# Patient Record
Sex: Female | Born: 2000
Health system: Southern US, Community
[De-identification: ages and names within clinical notes are randomized; demographics above are authoritative.]

## PROBLEM LIST (undated history)

## (undated) DIAGNOSIS — R569 Unspecified convulsions: Secondary | ICD-10-CM

## (undated) DIAGNOSIS — R51 Headache: Secondary | ICD-10-CM

## (undated) DIAGNOSIS — G8194 Hemiplegia, unspecified affecting left nondominant side: Secondary | ICD-10-CM

## (undated) DIAGNOSIS — I63541 Cerebral infarction due to unspecified occlusion or stenosis of right cerebellar artery: Secondary | ICD-10-CM

## (undated) DIAGNOSIS — F419 Anxiety disorder, unspecified: Secondary | ICD-10-CM

## (undated) DIAGNOSIS — G809 Cerebral palsy, unspecified: Secondary | ICD-10-CM

## (undated) DIAGNOSIS — G40909 Epilepsy, unspecified, not intractable, without status epilepticus: Secondary | ICD-10-CM

## (undated) HISTORY — DX: Hemiplegia, unspecified affecting left nondominant side: G81.94

## (undated) HISTORY — DX: Cerebral infarction due to unspecified occlusion or stenosis of right cerebellar artery: I63.541

## (undated) HISTORY — DX: Cerebral palsy, unspecified: G80.9

## (undated) HISTORY — DX: Anxiety disorder, unspecified: F41.9

## (undated) HISTORY — DX: Headache: R51

## (undated) HISTORY — PX: NO PAST SURGERIES: SHX2092

## (undated) HISTORY — DX: Epilepsy, unspecified, not intractable, without status epilepticus: G40.909

## (undated) HISTORY — DX: Unspecified convulsions: R56.9

---
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
## 2019-01-13 ENCOUNTER — Ambulatory Visit: Payer: 59 | Admitting: Child and Adolescent Psychiatry

## 2019-02-23 DIAGNOSIS — H52223 Regular astigmatism, bilateral: Secondary | ICD-10-CM | POA: Diagnosis not present

## 2019-02-23 DIAGNOSIS — H5213 Myopia, bilateral: Secondary | ICD-10-CM | POA: Diagnosis not present

## 2019-04-12 ENCOUNTER — Encounter: Payer: Self-pay | Admitting: Family Medicine

## 2019-04-12 ENCOUNTER — Ambulatory Visit (INDEPENDENT_AMBULATORY_CARE_PROVIDER_SITE_OTHER): Payer: BC Managed Care – PPO | Admitting: Family Medicine

## 2019-04-12 ENCOUNTER — Other Ambulatory Visit: Payer: Self-pay

## 2019-04-12 VITALS — BP 106/60 | HR 74

## 2019-04-12 DIAGNOSIS — Z309 Encounter for contraceptive management, unspecified: Secondary | ICD-10-CM

## 2019-04-12 LAB — POCT URINE PREGNANCY: Preg Test, Ur: NEGATIVE

## 2019-04-12 MED ORDER — DROSPIRENONE-ETHINYL ESTRADIOL 3-0.02 MG PO TABS: 1.0000 | ORAL_TABLET | Freq: Every day | ORAL | 11 refills | Status: DC

## 2019-04-12 NOTE — Patient Instructions (Signed)
Thank you for coming in to see [place] today! Please see below to review our plan for today's visit:  1. You are being prescribed generic Yaz combination birth control to help with heavy periods and menstrual cramps. Do not smoke while taking this medicine as this can cause blood clots. Know that alcohol can reduce the effect of this medication.   Please call the clinic at [phone] if your symptoms worsen or you have any concerns. It was our pleasure to serve you!   Dr. Milus Banister [facility]

## 2019-04-12 NOTE — Progress Notes (Signed)
   Subjective:    Patient ID: Christina Mccall, female    DOB: 15-Jan-2001, 18 y.o.   MRN: [id number]   CC: Contraceptive planning  HPI: Patient is a 18 year old with history of abnormal uterine bleeding and painful periods is coming in for contraceptive planning.  She was recently on Depo but said this made her gain a lot of weight.  The patient says she is interested in taking a birth control pill for contraception.  The patient was explained that she should plan to take the pill every day at the same time and that it is not 100% effective at preventing pregnancy.  She was also warned that birth control does not prevent sexually transmitted infection and if she plans to have sex she should wear a form of barrier protection, such as a condom.  The patient was counseled to not consume alcohol as this will decrease the effectiveness of her birth control and to avoid smoking as this can cause increased chances of clots.  Otherwise, she denies fevers, headaches, vision changes, chest pain, shortness of breath, nausea, vomiting, constipation, diarrhea, and new/changed focal weaknesses.  The patient does have a history of migraine but this is without aura, and therefore can have combination birth control.  Smoking status reviewed: Never smoker  Review of Systems: See HPI   Objective:  BP 106/60   Pulse 74  Vitals and nursing note reviewed  General: well nourished, in no acute distress Cardiac: RRR, clear S1 and S2, no murmurs, rubs, or gallops Respiratory: clear to auscultation bilaterally, no increased work of breathing Abdomen: soft, nontender, nondistended, no masses or organomegaly. Bowel sounds present Extremities: no edema or cyanosis. Warm, well perfused. 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   Dr. Milus Banister [facility], PGY-2

## 2019-04-12 NOTE — Assessment & Plan Note (Signed)
-  Start generic Yaz to be taken daily at the same time

## 2019-04-22 ENCOUNTER — Other Ambulatory Visit: Payer: Self-pay

## 2019-04-22 ENCOUNTER — Ambulatory Visit (HOSPITAL_COMMUNITY)
Admission: EM | Admit: 2019-04-22 | Discharge: 2019-04-22 | Disposition: A | Discharge status: Home / Self Care | Payer: No Typology Code available for payment source | Source: Ambulatory Visit | Attending: Emergency Medicine | Admitting: Emergency Medicine

## 2019-04-22 ENCOUNTER — Encounter (HOSPITAL_COMMUNITY): Payer: Self-pay | Admitting: Emergency Medicine

## 2019-04-22 ENCOUNTER — Telehealth (HOSPITAL_COMMUNITY): Payer: Self-pay | Admitting: Student

## 2019-04-22 ENCOUNTER — Emergency Department (HOSPITAL_COMMUNITY)
Admission: EM | Admit: 2019-04-22 | Discharge: 2019-04-22 | Disposition: A | Discharge status: Home / Self Care | Payer: BC Managed Care – PPO | Attending: Emergency Medicine | Admitting: Emergency Medicine

## 2019-04-22 DIAGNOSIS — T7421XA Adult sexual abuse, confirmed, initial encounter: Secondary | ICD-10-CM | POA: Diagnosis not present

## 2019-04-22 DIAGNOSIS — Z0441 Encounter for examination and observation following alleged adult rape: Secondary | ICD-10-CM | POA: Insufficient documentation

## 2019-04-22 DIAGNOSIS — N3000 Acute cystitis without hematuria: Secondary | ICD-10-CM | POA: Diagnosis not present

## 2019-04-22 DIAGNOSIS — G809 Cerebral palsy, unspecified: Secondary | ICD-10-CM | POA: Insufficient documentation

## 2019-04-22 DIAGNOSIS — R102 Pelvic and perineal pain: Secondary | ICD-10-CM | POA: Diagnosis not present

## 2019-04-22 DIAGNOSIS — Z79899 Other long term (current) drug therapy: Secondary | ICD-10-CM | POA: Diagnosis not present

## 2019-04-22 LAB — URINALYSIS, ROUTINE W REFLEX MICROSCOPIC
Bilirubin Urine: NEGATIVE
Glucose, UA: NEGATIVE mg/dL
Hgb urine dipstick: NEGATIVE
Ketones, ur: NEGATIVE mg/dL
Nitrite: NEGATIVE
Protein, ur: NEGATIVE mg/dL
Specific Gravity, Urine: 1.024 (ref 1.005–1.030)
Trans Epithel, UA: 1
pH: 5 (ref 5.0–8.0)

## 2019-04-22 MED ORDER — CEPHALEXIN 500 MG PO CAPS: 500.0000 mg | ORAL_CAPSULE | Freq: Three times a day (TID) | ORAL | 0 refills | Status: AC
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
## 2019-04-22 NOTE — Telephone Encounter (Signed)
Patient called and notified about urinalysis results, Keflex called into pharmacy, pharmacy verified with patient.  She expresses understanding and agreement with this plan.

## 2019-04-22 NOTE — ED Notes (Signed)
Sane nurse at bedside 

## 2019-04-22 NOTE — SANE Note (Signed)
The SANE/FNE Naval architect) consult has been completed. The primaryRN and physician/PA have been notified. Please contact the SANE/FNE nurse on call (listed in [place]) with any further concerns.
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
## 2019-04-22 NOTE — ED Notes (Signed)
Ivin Booty moms name she would like to be updated at [phone]
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
## 2019-04-25 LAB — POC URINE PREG, ED: Preg Test, Ur: NEGATIVE

## 2019-05-17 ENCOUNTER — Other Ambulatory Visit: Payer: Self-pay

## 2019-05-17 ENCOUNTER — Encounter: Payer: Self-pay | Admitting: Child and Adolescent Psychiatry

## 2019-05-17 ENCOUNTER — Ambulatory Visit (INDEPENDENT_AMBULATORY_CARE_PROVIDER_SITE_OTHER): Payer: 59 | Admitting: Child and Adolescent Psychiatry

## 2019-05-17 DIAGNOSIS — F411 Generalized anxiety disorder: Secondary | ICD-10-CM

## 2019-05-17 DIAGNOSIS — F39 Unspecified mood [affective] disorder: Secondary | ICD-10-CM | POA: Diagnosis not present

## 2019-05-17 MED ORDER — SERTRALINE HCL 50 MG PO TABS: 75.0000 mg | ORAL_TABLET | Freq: Every day | ORAL | 0 refills | Status: DC

## 2019-05-17 MED ORDER — TRAZODONE HCL 50 MG PO TABS: 50.0000 mg | ORAL_TABLET | Freq: Every day | ORAL | 2 refills | Status: DC
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
## 2019-07-10 ENCOUNTER — Ambulatory Visit (INDEPENDENT_AMBULATORY_CARE_PROVIDER_SITE_OTHER): Payer: 59 | Admitting: Child and Adolescent Psychiatry

## 2019-07-10 ENCOUNTER — Other Ambulatory Visit: Payer: Self-pay

## 2019-07-10 ENCOUNTER — Encounter: Payer: Self-pay | Admitting: Child and Adolescent Psychiatry

## 2019-07-10 DIAGNOSIS — F411 Generalized anxiety disorder: Secondary | ICD-10-CM | POA: Diagnosis not present

## 2019-07-10 DIAGNOSIS — F901 Attention-deficit hyperactivity disorder, predominantly hyperactive type: Secondary | ICD-10-CM

## 2019-07-10 DIAGNOSIS — F39 Unspecified mood [affective] disorder: Secondary | ICD-10-CM

## 2019-07-10 MED ORDER — TRAZODONE HCL 50 MG PO TABS: 75.0000 mg | ORAL_TABLET | Freq: Every day | ORAL | 2 refills | Status: DC

## 2019-07-10 MED ORDER — HYDROXYZINE HCL 10 MG PO TABS: 10.0000 mg | ORAL_TABLET | Freq: Every evening | ORAL | 0 refills | Status: DC | PRN

## 2019-07-10 MED ORDER — SERTRALINE HCL 50 MG PO TABS: 75.0000 mg | ORAL_TABLET | Freq: Every day | ORAL | 2 refills | Status: DC
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
## 2019-07-13 ENCOUNTER — Telehealth: Payer: Self-pay

## 2019-07-13 NOTE — Telephone Encounter (Signed)
pt mother called wanted to know if the hydroxyzine had the same make up as benadryl states her daughter has seizure on that medication and she doesnt want to take any changes with the hydroxyzine

## 2019-07-13 NOTE — Telephone Encounter (Signed)
Yes hydroxyzine has a same make up as benadryl, they can choose to not take it. I had increased her trazodone so that should help with the sleep, and recommended to take hydroxyzine only if increased dose of trazodone does not work for her. Please call her and let her know. Thanks

## 2019-08-10 ENCOUNTER — Other Ambulatory Visit: Payer: Self-pay | Admitting: Child and Adolescent Psychiatry

## 2019-08-10 DIAGNOSIS — F411 Generalized anxiety disorder: Secondary | ICD-10-CM

## 2019-08-17 ENCOUNTER — Ambulatory Visit (INDEPENDENT_AMBULATORY_CARE_PROVIDER_SITE_OTHER): Payer: 59 | Admitting: Child and Adolescent Psychiatry

## 2019-08-17 ENCOUNTER — Encounter: Payer: Self-pay | Admitting: Child and Adolescent Psychiatry

## 2019-08-17 ENCOUNTER — Other Ambulatory Visit: Payer: Self-pay

## 2019-08-17 DIAGNOSIS — F411 Generalized anxiety disorder: Secondary | ICD-10-CM | POA: Diagnosis not present

## 2019-08-17 DIAGNOSIS — F902 Attention-deficit hyperactivity disorder, combined type: Secondary | ICD-10-CM

## 2019-08-17 DIAGNOSIS — F39 Unspecified mood [affective] disorder: Secondary | ICD-10-CM | POA: Diagnosis not present

## 2019-08-17 MED ORDER — SERTRALINE HCL 50 MG PO TABS: 75.0000 mg | ORAL_TABLET | Freq: Every day | ORAL | 2 refills | Status: DC

## 2019-08-17 MED ORDER — TRAZODONE HCL 50 MG PO TABS: 75.0000 mg | ORAL_TABLET | Freq: Every day | ORAL | 2 refills | Status: DC
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
## 2019-09-13 DIAGNOSIS — R519 Headache, unspecified: Secondary | ICD-10-CM | POA: Diagnosis not present

## 2019-09-13 DIAGNOSIS — R6883 Chills (without fever): Secondary | ICD-10-CM | POA: Diagnosis not present

## 2019-09-13 DIAGNOSIS — Z20828 Contact with and (suspected) exposure to other viral communicable diseases: Secondary | ICD-10-CM | POA: Diagnosis not present

## 2019-09-13 DIAGNOSIS — R05 Cough: Secondary | ICD-10-CM | POA: Diagnosis not present

## 2019-10-18 ENCOUNTER — Other Ambulatory Visit: Payer: Self-pay

## 2019-10-18 ENCOUNTER — Ambulatory Visit (INDEPENDENT_AMBULATORY_CARE_PROVIDER_SITE_OTHER): Payer: BC Managed Care – PPO | Admitting: Family Medicine

## 2019-10-18 ENCOUNTER — Encounter: Payer: Self-pay | Admitting: Family Medicine

## 2019-10-18 VITALS — BP 110/62 | HR 76 | Wt 136.0 lb

## 2019-10-18 DIAGNOSIS — K5909 Other constipation: Secondary | ICD-10-CM

## 2019-10-18 NOTE — Patient Instructions (Signed)
Please drink at least 64-72 oz of water a day, every day. Please ensure you are getting adequate fiber in yoru diet Take Miralax: 2 cap fulls a day, everyday, until you have a soft bowel movement. Then continue once a day until you continue to have regular soft bowel movements. Then you can taper down to every other day and then as needed.  Follow up in 2-3 weeks if no improvement with the above regimen.   Take care, Dr. Mauri Reading

## 2019-10-18 NOTE — Progress Notes (Signed)
   Subjective:   Patient ID: Christina Mccall    DOB: 2001-07-03, 19 y.o. female   MRN: [id number]  Christina Mccall is a 19 y.o. female here for Constipation  Constipation: Patient notes she has chronic constipation x years. Patient notes she hasn't had a bowel movement in 7 days. She notes on average she goes every four days. Notes her stools are brown in color and range from Type 1 or Type 2 on the [facility] stool chart. Notes she has abdominal pain, and has recently developed nausea. Drinks about 48 oz of water or less a day. Has tried OTC Miralax, metamucil, and laxatives. She used these more on an as needed bases. She notes they did not help.   Review of Systems:  Per HPI.   PMFSH, medications and smoking status reviewed.  Objective:   BP 110/62   Pulse 76   Wt 136 lb (61.7 kg)   SpO2 98%   BMI 22.63 kg/m  Vitals and nursing note reviewed.  General: very thing young female, sitting comfortably in exam chair, in no acute distress with non-toxic appearance Resp: Breathing comfortably on room air, speaking in full sentences Abdomen: soft, tender to deep palpation throughout, non-distended but stool firm stool palpable throughout, no masses or organomegaly palpable, normoactive bowel sounds appreciated Skin: warm, dry  MSK: antalgic gait  Assessment & Plan:   Other constipation Patient presenting with chronic constipation of Type 1 or 2 on bristol scale. No melena or BRBPR. She has had minimal relief with PRN OTC medicines. She only drinks at most 48 oz of water a day. On exam, firm stool noted throughout colon however normoactive bowel sounds appreciated. No concern for outlet obstruction at this time. However, if persistent despite treatment below then will need further imaging vs rectal exam to further evaluate. At this time, recommend increasing water consumption to at least 64-72oz per day, diet filled with adequate fiber, and Miralax 2 cap fulls BID until soft stool then QD until she  has regular soft stools, then PRN. Recommend she avoid laxatives as this can cause rebound constipation. Follow up if no improvement. Can also consider Amitiza given the chronicity of problem for patient.   Orpah Cobb, DO PGY-2, [facility] 10/19/2019 1:39 PM

## 2019-10-19 DIAGNOSIS — K5909 Other constipation: Secondary | ICD-10-CM | POA: Insufficient documentation

## 2019-10-19 NOTE — Assessment & Plan Note (Addendum)
Patient presenting with chronic constipation of Type 1 or 2 on bristol scale. No melena or BRBPR. She has had minimal relief with PRN OTC medicines. She only drinks at most 48 oz of water a day. On exam, firm stool noted throughout colon however normoactive bowel sounds appreciated. No concern for outlet obstruction at this time. However, if persistent despite treatment below then will need further imaging vs rectal exam to further evaluate. At this time, recommend increasing water consumption to at least 64-72oz per day, diet filled with adequate fiber, and Miralax 2 cap fulls BID until soft stool then QD until she has regular soft stools, then PRN. Recommend she avoid laxatives as this can cause rebound constipation. Follow up if no improvement. Can also consider Amitiza given the chronicity of problem for patient.

## 2019-11-16 ENCOUNTER — Other Ambulatory Visit: Payer: Self-pay

## 2019-11-16 ENCOUNTER — Ambulatory Visit (INDEPENDENT_AMBULATORY_CARE_PROVIDER_SITE_OTHER): Payer: 59 | Admitting: Child and Adolescent Psychiatry

## 2019-11-16 ENCOUNTER — Encounter: Payer: Self-pay | Admitting: Child and Adolescent Psychiatry

## 2019-11-16 DIAGNOSIS — F909 Attention-deficit hyperactivity disorder, unspecified type: Secondary | ICD-10-CM

## 2019-11-16 DIAGNOSIS — F411 Generalized anxiety disorder: Secondary | ICD-10-CM | POA: Diagnosis not present

## 2019-11-16 DIAGNOSIS — G4700 Insomnia, unspecified: Secondary | ICD-10-CM | POA: Diagnosis not present

## 2019-11-16 DIAGNOSIS — F39 Unspecified mood [affective] disorder: Secondary | ICD-10-CM | POA: Diagnosis not present

## 2019-11-16 MED ORDER — SERTRALINE HCL 50 MG PO TABS: 75.0000 mg | ORAL_TABLET | Freq: Every day | ORAL | 2 refills | Status: DC

## 2019-11-16 MED ORDER — TRAZODONE HCL 50 MG PO TABS: 100.0000 mg | ORAL_TABLET | Freq: Every day | ORAL | 2 refills | Status: DC
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
## 2020-02-02 ENCOUNTER — Other Ambulatory Visit: Payer: Self-pay

## 2020-02-02 ENCOUNTER — Ambulatory Visit (INDEPENDENT_AMBULATORY_CARE_PROVIDER_SITE_OTHER): Payer: BC Managed Care – PPO | Admitting: Family Medicine

## 2020-02-02 ENCOUNTER — Encounter: Payer: Self-pay | Admitting: Family Medicine

## 2020-02-02 VITALS — BP 96/62 | HR 74 | Ht 65.0 in | Wt 146.0 lb

## 2020-02-02 DIAGNOSIS — R350 Frequency of micturition: Secondary | ICD-10-CM

## 2020-02-02 LAB — POCT URINALYSIS DIP (MANUAL ENTRY)
Bilirubin, UA: NEGATIVE
Blood, UA: NEGATIVE
Glucose, UA: NEGATIVE mg/dL
Ketones, POC UA: NEGATIVE mg/dL
Nitrite, UA: NEGATIVE
Spec Grav, UA: >=1.03 — AB (ref 1.010–1.025)
Urobilinogen, UA: 0.2 E.U./dL
pH, UA: 6 (ref 5.0–8.0)

## 2020-02-02 NOTE — Progress Notes (Signed)
    SUBJECTIVE:   CHIEF COMPLAINT / HPI:   Frequent urination: States she urinates 40+ times per day, every day for years. States she has to get up 5-6 times or more a night to urinate. Some dysuria. No dizziness/lightheadedness. No blood in urine. Has had this happen before but it resolved on its own after a year. Currently this has been going on for 2 years or more. Drinks ~1 bottle of water per day and 1 can of soda every day or two.   PERTINENT  PMH / PSH: None relevant  OBJECTIVE:   BP 96/62   Pulse 74   Ht 5\' 5"  (1.651 m)   Wt 146 lb (66.2 kg)   SpO2 97%   BMI 24.30 kg/m   General: Alert and oriented in no apparent distress Heart: Regular rate and rhythm with no murmurs appreciated Lungs: CTA bilaterally, no wheezing  Abdomen: Bowel sounds present, mild abdominal discomfort to palpation in central abdomen without peritoneal findings. Skin: Warm and dry   ASSESSMENT/PLAN:   Urinary frequency Assessment: Urinary frequency with patient claiming to urinate 40+ times per day including waking up multiple times at night to urinate.  Patient denies large fluid intake.  Differential can include diabetes, other electrolyte disturbances, renal insufficiency or other renal issue, urinary tract infection, medication induced, psychogenic.  Urinalysis shows no signs of infection or glucose in the urine.  There is also the possibility that urinary frequency is not as dramatic as patient believes. Plan: -We will check BMP to look for creatinine as well as electrolyte disturbances -We will record 24-hour creatinine clearance via urine collection to determine both amount of urine and creatinine clearance -Recommend patient keep a diary of amount of time she is consumes any fluid as well as number of times that she voids per day -Follow-up in 2 months or sooner if symptoms worsen.     , DO [facility]

## 2020-02-02 NOTE — Patient Instructions (Addendum)
It was great to see you!  Our plans for today:  -We are checking some blood work to see how your kidney function electrolytes appear. -We are also going to do a 24-hour urine collection to determine the amount of urine you are losing each day. We will contact you once the labs result -I would like for you make a follow-up appointment if you have worsening symptoms or any change in symptoms.    -I would also like for you to keep a diary of number of times you are urinating and amounts of intake you are bringing in.  We are checking some labs today, I will call you if they are abnormal will send you a MyChart message or a letter if they are normal.  If you do not hear about your labs in the next 2 weeks please let us know.  Take care and seek immediate care sooner if you develop any concerns.   Dr. [facility]

## 2020-02-02 NOTE — Assessment & Plan Note (Signed)
Assessment: Urinary frequency with patient claiming to urinate 40+ times per day including waking up multiple times at night to urinate.  Patient denies large fluid intake.  Differential can include diabetes, other electrolyte disturbances, renal insufficiency or other renal issue, urinary tract infection, medication induced, psychogenic.  Urinalysis shows no signs of infection or glucose in the urine.  There is also the possibility that urinary frequency is not as dramatic as patient believes. Plan: -We will check BMP to look for creatinine as well as electrolyte disturbances -We will record 24-hour creatinine clearance via urine collection to determine both amount of urine and creatinine clearance -Recommend patient keep a diary of amount of time she is consumes any fluid as well as number of times that she voids per day -Follow-up in 2 months or sooner if symptoms worsen.

## 2020-02-03 LAB — BASIC METABOLIC PANEL
BUN/Creatinine Ratio: 14 (ref 9–23)
BUN: 11 mg/dL (ref 6–20)
CO2: 20 mmol/L (ref 20–29)
Calcium: 9.1 mg/dL (ref 8.7–10.2)
Chloride: 109 mmol/L — ABNORMAL HIGH (ref 96–106)
Creatinine, Ser: 0.79 mg/dL (ref 0.57–1.00)
GFR calc Af Amer: 125 mL/min/{1.73_m2} (ref >59)
GFR calc non Af Amer: 108 mL/min/{1.73_m2} (ref >59)
Glucose: 84 mg/dL (ref 65–99)
Potassium: 4.5 mmol/L (ref 3.5–5.2)
Sodium: 143 mmol/L (ref 134–144)

## 2020-02-05 ENCOUNTER — Other Ambulatory Visit: Payer: Self-pay | Admitting: Child and Adolescent Psychiatry

## 2020-02-05 DIAGNOSIS — F39 Unspecified mood [affective] disorder: Secondary | ICD-10-CM

## 2020-02-07 ENCOUNTER — Telehealth (INDEPENDENT_AMBULATORY_CARE_PROVIDER_SITE_OTHER): Payer: BC Managed Care – PPO | Admitting: Child and Adolescent Psychiatry

## 2020-02-07 ENCOUNTER — Other Ambulatory Visit: Payer: Self-pay

## 2020-02-07 DIAGNOSIS — G4700 Insomnia, unspecified: Secondary | ICD-10-CM

## 2020-02-07 DIAGNOSIS — F909 Attention-deficit hyperactivity disorder, unspecified type: Secondary | ICD-10-CM

## 2020-02-07 DIAGNOSIS — F39 Unspecified mood [affective] disorder: Secondary | ICD-10-CM

## 2020-02-07 DIAGNOSIS — F411 Generalized anxiety disorder: Secondary | ICD-10-CM | POA: Diagnosis not present

## 2020-02-07 MED ORDER — TRAZODONE HCL 50 MG PO TABS: ORAL_TABLET | ORAL | 2 refills | Status: DC

## 2020-02-07 MED ORDER — SERTRALINE HCL 50 MG PO TABS: 75.0000 mg | ORAL_TABLET | Freq: Every day | ORAL | 2 refills | Status: DC

## 2020-02-07 NOTE — Progress Notes (Signed)
Virtual Visit via Video Note  I connected with Christina Mccall on 02/07/20 at  2:00 PM EDT by a video enabled telemedicine application and verified that I am speaking with the correct person using two identifiers.  Location: Patient: Home Provider: Office   I discussed the limitations of evaluation and management by telemedicine and the availability of in person appointments. The patient expressed understanding and agreed to proceed.    I discussed the assessment and treatment plan with the patient. The patient was provided an opportunity to ask questions and all were answered. The patient agreed with the plan and demonstrated an understanding of the instructions.   The patient was advised to call back or seek an in-person evaluation if the symptoms worsen or if the condition fails to improve as anticipated.  I provided 15 minutes of non-face-to-face time during this encounter.   Darcel Smalling, MD    [facility] MD/PA/NP OP Progress Note  02/07/2020 2:15 PM Christina Mccall  MRN:  [id number]  Chief Complaint: Medication management follow-up for mood and anxiety.    Synopsis: This is a 19 year old Caucasian female with medical history significant of left hemiparesis since 2002, generalized epilepsy and psychiatric history significant of mood disorder, generalized anxiety disorder and ADHD has been under outpatient psychiatric care at [facility] since 2015, and med trials include Effexor XR, Lamictal, Abilify, Topamax in the past, stable on Zoloft and Trazodone.   HPI: Patient was seen and evaluated over telemedicine encounter for medication management follow-up.  She had an appointment scheduled next week however she called this morning and rescheduled it to today because she ran out of her medications.  Velmer reports that she has been doing well overall, denies any problems with mood or anxiety.  Denies feeling depressed or having low lows.  She reports that she has some anxiety regarding her  future, but that is manageable and rates her anxiety at 2 out of 10(10 = most anxious).  She reports some anhedonia, problems with sleep because she has to wake up multiple times at night due to urinary frequency, denies problems with energy or concentration, reports that her appetite is sometimes more and sometimes less.  She reports that she is doing well on her current medications and would like to continue on the same.  She reports that she mood to her grandmother in [place] about a month ago to help her ailing grandmother who has lung cancer.  She reports that her uncle, aunt and their 2 children also live there.  She reports that she has been helping her grandmother and watches TV in her free time.  She reports that she recently saw medical doctor because of her urinary frequency which she reports that has been going on for years.  She reports that he has recommended her to collect urine over 24 hours and once the results will come they'll be able to tell her more about the reasons for urinary frequency.  She was recommended to continue follow-up with PCP.  Her PHQ-9 score today was 8.  Visit Diagnosis:    ICD-10-CM   1. Generalized anxiety disorder  F41.1 sertraline (ZOLOFT) 50 MG tablet  2. Episodic mood disorder (HCC)  F39 sertraline (ZOLOFT) 50 MG tablet    traZODone (DESYREL) 50 MG tablet    Past Psychiatric History: As mentioned in initial H&P, reviewed today, as following Patient has never been hospitalized psychiatrically, does not have any history of suicide, was previously seeing Dr. Lucianne Muss and then Dr. Karie Schwalbe at the  [facility] outpatient clinic before transitioning to Dr. Daleen Bo and subsequently to this writer.  She is currently not in any counseling.   Past Medical History:  Past Medical History:  Diagnosis Date  . Anxiety   . Cerebral infarction involving right cerebellar artery (HCC)    intrauterine  . CP (cerebral palsy) (HCC)   . Epilepsy (HCC)   . Headache(784.0)   . Left hemiparesis  (HCC)   . Seizures (HCC)     Past Surgical History:  Procedure Laterality Date  . NO PAST SURGERIES      Family Psychiatric History: Reviewed today and as following Paternal Uncle - Depression; Mother - Anxiety, reviewed today and no change.  Family History:  Family History  Problem Relation Age of Onset  . Migraines Maternal Grandfather   . Alcohol abuse Mother   . Anxiety disorder Father   . Alcohol abuse Father     Social History:  Social History   Socioeconomic History  . Marital status: Single    Spouse name: Not on file  . Number of children: Not on file  . Years of education: Not on file  . Highest education level: Not on file  Occupational History  . Not on file  Tobacco Use  . Smoking status: Never Smoker  . Smokeless tobacco: Never Used  Substance and Sexual Activity  . Alcohol use: No  . Drug use: No  . Sexual activity: Never  Other Topics Concern  . Not on file  Social History Narrative   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 She lives with her parents and siblings in separate households. She enjoys swimming, being with her family and eating ice cream.    Social Determinants of Health   Financial Resource Strain:   . Difficulty of Paying Living Expenses:   Food Insecurity:   . Worried About Programme researcher, broadcasting/film/video in the Last Year:   . Barista in the Last Year:   Transportation Needs:   . Freight forwarder (Medical):   Marland Kitchen Lack of Transportation (Non-Medical):   Physical Activity:   . Days of Exercise per Week:   . Minutes of Exercise per Session:   Stress:   . Feeling of Stress :   Social Connections:   . Frequency of Communication with Friends and Family:   . Frequency of Social Gatherings with Friends and Family:   . Attends Religious Services:   . Active Member of Clubs or Organizations:   . Attends Banker Meetings:   Marland Kitchen Marital Status:     Allergies:  Allergies  Allergen  Reactions  . Benadryl [Diphenhydramine] Other (See Comments)    seizures    Metabolic Disorder Labs: No results found for: HGBA1C, MPG No results found for: PROLACTIN No results found for: CHOL, TRIG, HDL, CHOLHDL, VLDL, LDLCALC Lab Results  Component Value Date   TSH 1.970 03/11/2018    Therapeutic Level Labs: No results found for: LITHIUM No results found for: VALPROATE No components found for:  CBMZ  Current Medications: Current Outpatient Medications  Medication Sig Dispense Refill  . drospirenone-ethinyl estradiol (YAZ) 3-0.02 MG tablet Take 1 tablet by mouth daily. 3 Package 11  . sertraline (ZOLOFT) 50 MG tablet Take 1.5 tablets (75 mg total) by mouth daily. 45 tablet 2  . traZODone (DESYREL) 50 MG tablet TAKE TABLETS (100 MG TOTAL) BY MOUTH AT BEDTIME. 60 tablet 2   No current facility-administered medications  for this visit.     Musculoskeletal:  Gait & Station:unable to assess since visit was over the telemedicine. Patient leans: unable to assess since visit was over the telemedicine.  Psychiatric Specialty Exam: ROSReview of 12 systems negative except as mentioned in HPI  There were no vitals taken for this visit.There is no height or weight on file to calculate BMI.  General Appearance: Casual and Neat  Eye Contact:  Good  Speech:  Clear and Coherent and Normal Rate  Volume:  Normal  Mood:  "good"  Affect:  Appropriate, Congruent and Full Range  Thought Process:  Goal Directed and Linear  Orientation:  Full (Time, Place, and Person)  Thought Content: Logical   Suicidal Thoughts:  No  Homicidal Thoughts:  No  Memory:  Immediate;   Fair Recent;   Fair Remote;   Fair  Judgement:  Fair  Insight:  Fair  Psychomotor Activity:  Normal  Concentration:  Concentration: Fair and Attention Span: Fair  Recall:  [employer] of Knowledge: Fair  Language: Fair  Akathisia:  NA  Handed:  Right  AIMS (if indicated): not done  Assets:  Desire for  Improvement Financial Resources/Insurance Housing Social Support  ADL's:  Intact  Cognition: WNL  Sleep:  Fair   Screenings: PHQ2-9     Office Visit from 02/02/2020 in [place] Office Visit from 10/18/2019 in [place] Office Visit from 04/12/2019 in [place] Office Visit from 03/11/2018 in [place] Office Visit from 12/27/2017 in [place]  PHQ-2 Total Score  4  5  0  0  0  PHQ-9 Total Score  14  14  --  8  --       Assessment and Plan:   # Generalized Anxiety disorder- Chronic and stable - Continue with Zoloft 75 mg daily  # ADHD Chronic and improved - Stopped Dexedrine 2.5 mg BID - Pt does not feel the need to take it.   # Mood disorder - Chronic and stable - Continue with Zoloft 75 mg daily.   # Insomnia - Chronic and worse - Continue Trazodone 100mg  QHS  - Return to clinic in 3 months or early if needed.   [name], MD 02/07/2020, 2:15 PM

## 2020-02-08 ENCOUNTER — Telehealth: Payer: Self-pay

## 2020-02-08 DIAGNOSIS — F39 Unspecified mood [affective] disorder: Secondary | ICD-10-CM

## 2020-02-08 MED ORDER — TRAZODONE HCL 50 MG PO TABS: ORAL_TABLET | ORAL | 2 refills | Status: DC

## 2020-02-08 NOTE — Telephone Encounter (Signed)
pt called because when they picked up the rx for the trazodone it stated to take one a day but it had 60 pills in bottle and she thought you told her to take two.  (i read your note and it states to take 100mg  so i told her to take two of the 50mg  ) please correct the rx and resend to [facility].

## 2020-02-08 NOTE — Telephone Encounter (Signed)
Corrected rx and sent again

## 2020-02-15 ENCOUNTER — Telehealth: Payer: 59 | Admitting: Child and Adolescent Psychiatry

## 2020-05-08 ENCOUNTER — Encounter: Payer: Self-pay | Admitting: Child and Adolescent Psychiatry

## 2020-05-08 ENCOUNTER — Telehealth (INDEPENDENT_AMBULATORY_CARE_PROVIDER_SITE_OTHER): Payer: BC Managed Care – PPO | Admitting: Child and Adolescent Psychiatry

## 2020-05-08 ENCOUNTER — Other Ambulatory Visit: Payer: Self-pay | Admitting: Family Medicine

## 2020-05-08 ENCOUNTER — Other Ambulatory Visit: Payer: Self-pay

## 2020-05-08 DIAGNOSIS — F331 Major depressive disorder, recurrent, moderate: Secondary | ICD-10-CM

## 2020-05-08 DIAGNOSIS — Z309 Encounter for contraceptive management, unspecified: Secondary | ICD-10-CM

## 2020-05-08 DIAGNOSIS — F411 Generalized anxiety disorder: Secondary | ICD-10-CM | POA: Diagnosis not present

## 2020-05-08 MED ORDER — SERTRALINE HCL 100 MG PO TABS: 100.0000 mg | ORAL_TABLET | Freq: Every day | ORAL | 0 refills | Status: DC

## 2020-05-08 MED ORDER — TRAZODONE HCL 50 MG PO TABS: ORAL_TABLET | ORAL | 2 refills | Status: DC

## 2020-05-08 NOTE — Progress Notes (Signed)
Virtual Visit via Video Note  I connected with Christina Mccall on 05/08/20 at  2:00 PM EDT by a video enabled telemedicine application and verified that I am speaking with the correct person using two identifiers.  Location: Patient: Home Provider: Office   I discussed the limitations of evaluation and management by telemedicine and the availability of in person appointments. The patient expressed understanding and agreed to proceed.    I discussed the assessment and treatment plan with the patient. The patient was provided an opportunity to ask questions and all were answered. The patient agreed with the plan and demonstrated an understanding of the instructions.   The patient was advised to call back or seek an in-person evaluation if the symptoms worsen or if the condition fails to improve as anticipated.  I provided 15 minutes of non-face-to-face time during this encounter.   Darcel Smalling, MD    [facility] MD/PA/NP OP Progress Note  05/08/2020 2:22 PM Christina Mccall  MRN:  [id number]  Chief Complaint: Medication management follow-up for mood and anxiety.    Synopsis: This is a 19 year old Caucasian female with medical history significant of left hemiparesis since 2002, generalized epilepsy and psychiatric history significant of mood disorder, generalized anxiety disorder and ADHD has been under outpatient psychiatric care at [facility] since 2015, and med trials include Effexor XR, Lamictal, Abilify, Topamax in the past, stable on Zoloft and Trazodone.   HPI: Patient was seen and evaluated over telemedicine encounter for medication management follow-up.  [name] reports that she has been feeling more depressed recently.  She reports that she has been depressed for years but she has not been talking about this.  She describes her depression as feeling down, not wanting to do anything, staying in her room more.  She reports she still enjoys hanging out with her family and talking to her friends.   She reports appetite disturbances and reports that she does not eat well during the day but eats excessively during the dinner.  She reports that she goes to bed very late because it is hard for her to get tired but once she takes her trazodone she gets tired and goes to sleep.  She reports that she usually takes trazodone about 1 AM.  She was recommended to take trazodone early so that she could go to sleep early.  She reports that she has intermittent passive suicidal thoughts, denies any active suicidal thoughts/intent/plan.  Denies any current suicidal thoughts.  She reports that she thinks about her friends and family that helps her with these thoughts.  She reports that she has been adherent to her medications and wonders if she can increase her medication for depression.  In regards of anxiety she reports that her anxiety is around 6 out of 10(10 = most anxious).  We discussed to increase her Zoloft to 100 mg once a day while continuing her trazodone 100 mg at night.  She verbalized understanding.  Writer also offered referral for therapy however she would like to discuss this with her mother before deciding on therapy.  She was recommended to call this office if she decides to see a therapist.  She also reports that she is looking for a job and Clinical research associate encouraged her.  Discussed that working outside may also help her with her mood.  Visit Diagnosis:    ICD-10-CM   1. Moderate episode of recurrent major depressive disorder (HCC)  F33.1 sertraline (ZOLOFT) 100 MG tablet    traZODone (DESYREL) 50 MG tablet  2. Generalized anxiety disorder  F41.1 sertraline (ZOLOFT) 100 MG tablet    Past Psychiatric History: As mentioned in initial H&P, reviewed today, as following Patient has never been hospitalized psychiatrically, does not have any history of suicide, was previously seeing Dr. Lucianne Muss and then Dr. Karie Schwalbe at the [facility] outpatient clinic before transitioning to Dr. Daleen Bo and subsequently to this writer.   She is currently not in any counseling.   Past Medical History:  Past Medical History:  Diagnosis Date  . Anxiety   . Cerebral infarction involving right cerebellar artery (HCC)    intrauterine  . CP (cerebral palsy) (HCC)   . Epilepsy (HCC)   . Headache(784.0)   . Left hemiparesis (HCC)   . Seizures (HCC)     Past Surgical History:  Procedure Laterality Date  . NO PAST SURGERIES      Family Psychiatric History: Reviewed today and as following Paternal Uncle - Depression; Mother - Anxiety, reviewed today and no change.  Family History:  Family History  Problem Relation Age of Onset  . Migraines Maternal Grandfather   . Alcohol abuse Mother   . Anxiety disorder Father   . Alcohol abuse Father     Social History:  Social History   Socioeconomic History  . Marital status: Single    Spouse name: Not on file  . Number of children: Not on file  . Years of education: Not on file  . Highest education level: Not on file  Occupational History  . Not on file  Tobacco Use  . Smoking status: Never Smoker  . Smokeless tobacco: Never Used  Substance and Sexual Activity  . Alcohol use: No  . Drug use: No  . Sexual activity: Never  Other Topics Concern  . Not on file  Social History Narrative   Shenekia is a 9th grade student at [employer]; she does well in school. She lives with her parents and siblings in separate households. She enjoys swimming, being with her family and eating ice cream.    Social Determinants of Health   Financial Resource Strain:   . Difficulty of Paying Living Expenses:   Food Insecurity:   . Worried About Programme researcher, broadcasting/film/video in the Last Year:   . Barista in the Last Year:   Transportation Needs:   . Freight forwarder (Medical):   Marland Kitchen Lack of Transportation (Non-Medical):   Physical Activity:   . Days of Exercise per Week:   . Minutes of Exercise per Session:   Stress:   . Feeling of Stress :   Social Connections:   .  Frequency of Communication with Friends and Family:   . Frequency of Social Gatherings with Friends and Family:   . Attends Religious Services:   . Active Member of Clubs or Organizations:   . Attends Banker Meetings:   Marland Kitchen Marital Status:     Allergies:  Allergies  Allergen Reactions  . Benadryl [Diphenhydramine] Other (See Comments)    seizures    Metabolic Disorder Labs: No results found for: HGBA1C, MPG No results found for: PROLACTIN No results found for: CHOL, TRIG, HDL, CHOLHDL, VLDL, LDLCALC Lab Results  Component Value Date   TSH 1.970 03/11/2018    Therapeutic Level Labs: No results found for: LITHIUM No results found for: VALPROATE No components found for:  CBMZ  Current Medications: Current Outpatient Medications  Medication Sig Dispense Refill  . drospirenone-ethinyl estradiol (YAZ) 3-0.02 MG tablet Take 1 tablet  by mouth daily. 3 Package 11  . sertraline (ZOLOFT) 100 MG tablet Take 1 tablet (100 mg total) by mouth daily. 90 tablet 0  . traZODone (DESYREL) 50 MG tablet TAKE 2 TABLETS (100 MG TOTAL) BY MOUTH AT BEDTIME. 60 tablet 2   No current facility-administered medications for this visit.     Musculoskeletal:  Gait & Station:unable to assess since visit was over the telemedicine. Patient leans: unable to assess since visit was over the telemedicine.  Psychiatric Specialty Exam: ROSReview of 12 systems negative except as mentioned in HPI  There were no vitals taken for this visit.There is no height or weight on file to calculate BMI.  General Appearance: Casual and Neat  Eye Contact:  Good  Speech:  Clear and Coherent and Normal Rate  Volume:  Normal  Mood:  "good"  Affect:  Appropriate, Congruent and Full Range  Thought Process:  Goal Directed and Linear  Orientation:  Full (Time, Place, and Person)  Thought Content: Logical   Suicidal Thoughts:  No  Homicidal Thoughts:  No  Memory:  Immediate;   Fair Recent;   Fair Remote;    Fair  Judgement:  Fair  Insight:  Fair  Psychomotor Activity:  Normal  Concentration:  Concentration: Fair and Attention Span: Fair  Recall:  [employer] of Knowledge: Fair  Language: Fair  Akathisia:  NA  Handed:  Right  AIMS (if indicated): not done  Assets:  Desire for Improvement Financial Resources/Insurance Housing Social Support  ADL's:  Intact  Cognition: WNL  Sleep:  Fair   Screenings: PHQ2-9     Office Visit from 02/02/2020 in [facility] Office Visit from 10/18/2019 in [facility] Office Visit from 04/12/2019 in [facility] Office Visit from 03/11/2018 in [facility] Office Visit from 12/27/2017 in [facility]  PHQ-2 Total Score 4 5 0 0 0  PHQ-9 Total Score 14 14 -- 8 --       Assessment and Plan:   # Generalized Anxiety disorder- Chronic and stable - Continue with Zoloft 75 mg daily  # ADHD Chronic and improved - Stopped Dexedrine 2.5 mg BID - Pt does not feel the need to take it.   # Mood - worse - Increase Zoloft to 100mg  daily.   # Insomnia - Chronic and stable - Continue Trazodone 100mg  QHS  - Return to clinic in 6 weeks or early if needed.   , MD 05/08/2020, 2:22 PM

## 2020-05-09 ENCOUNTER — Telehealth: Payer: Self-pay

## 2020-05-09 DIAGNOSIS — F411 Generalized anxiety disorder: Secondary | ICD-10-CM

## 2020-05-09 DIAGNOSIS — F331 Major depressive disorder, recurrent, moderate: Secondary | ICD-10-CM

## 2020-05-09 MED ORDER — SERTRALINE HCL 100 MG PO TABS: 100.0000 mg | ORAL_TABLET | Freq: Every day | ORAL | 0 refills | Status: DC

## 2020-05-09 MED ORDER — TRAZODONE HCL 50 MG PO TABS: ORAL_TABLET | ORAL | 2 refills | Status: DC

## 2020-05-09 NOTE — Telephone Encounter (Signed)
pt called states that her pharmacy is [facility] not [facility].  pharmacy had been updated in the system.  please send rx's to [facility]

## 2020-06-27 ENCOUNTER — Telehealth: Payer: BC Managed Care – PPO | Admitting: Child and Adolescent Psychiatry

## 2020-06-27 ENCOUNTER — Telehealth: Payer: Self-pay | Admitting: Child and Adolescent Psychiatry

## 2020-06-27 ENCOUNTER — Other Ambulatory Visit: Payer: Self-pay

## 2020-06-27 NOTE — Telephone Encounter (Signed)
Pt was sent link via text and email to connect on video for telemedicine encounter for scheduled appointment, and was also followed up with phone call. Pt did not connect on the video, and writer could not leave VM as VM box was not set up.

## 2020-07-29 ENCOUNTER — Other Ambulatory Visit: Payer: Self-pay | Admitting: Child and Adolescent Psychiatry

## 2020-07-29 DIAGNOSIS — F331 Major depressive disorder, recurrent, moderate: Secondary | ICD-10-CM

## 2020-09-06 ENCOUNTER — Telehealth (INDEPENDENT_AMBULATORY_CARE_PROVIDER_SITE_OTHER): Payer: BC Managed Care – PPO | Admitting: Child and Adolescent Psychiatry

## 2020-09-06 ENCOUNTER — Other Ambulatory Visit: Payer: Self-pay

## 2020-09-06 ENCOUNTER — Other Ambulatory Visit: Payer: Self-pay | Admitting: Child and Adolescent Psychiatry

## 2020-09-06 DIAGNOSIS — F3341 Major depressive disorder, recurrent, in partial remission: Secondary | ICD-10-CM

## 2020-09-06 DIAGNOSIS — F411 Generalized anxiety disorder: Secondary | ICD-10-CM

## 2020-09-06 MED ORDER — TRAZODONE HCL 50 MG PO TABS: 100.0000 mg | ORAL_TABLET | Freq: Every day | ORAL | 0 refills | Status: DC

## 2020-09-06 MED ORDER — SERTRALINE HCL 100 MG PO TABS: 100.0000 mg | ORAL_TABLET | Freq: Every day | ORAL | 0 refills | Status: DC

## 2020-09-06 NOTE — Progress Notes (Signed)
Virtual Visit via Telephone Note  I connected with Christina Mccall on 09/06/20 at  8:30 AM EST by telephone and verified that I am speaking with the correct person using two identifiers.  Location: Patient: home Provider: office   I discussed the limitations, risks, security and privacy concerns of performing an evaluation and management service by telephone and the availability of in person appointments. I also discussed with the patient that there may be a patient responsible charge related to this service. The patient expressed understanding and agreed to proceed.     I discussed the assessment and treatment plan with the patient. The patient was provided an opportunity to ask questions and all were answered. The patient agreed with the plan and demonstrated an understanding of the instructions.   The patient was advised to call back or seek an in-person evaluation if the symptoms worsen or if the condition fails to improve as anticipated.  I provided 15 minutes of non-face-to-face time during this encounter.   Darcel Smalling, MD     [facility] MD/PA/NP OP Progress Note  09/06/2020 8:48 AM Christina Mccall  MRN:  [id number]  Chief Complaint: Medication management follow-up for mood and anxiety.    Synopsis: This is a 19 year old Caucasian female with medical history significant of left hemiparesis since 2002, generalized epilepsy and psychiatric history significant of mood disorder, generalized anxiety disorder and ADHD has been under outpatient psychiatric care at [facility] since 2015, and med trials include Effexor XR, Lamictal, Abilify, Topamax in the past, stable on Zoloft and Trazodone.   HPI: Patient was seen and evaluated over telemedicine encounter for medication management follow-up.  Christina Mccall reports that she started working since our last 4 months and therefore got busy and could not make it to her last appointment in September.  She reports that she is running out of her medications  and therefore made this appointment.  She reports that since the last appointment she has been working, enjoys her work and her mood has significantly improved.  She reports that she does not feel depressed or sad anymore, denies anhedonia, denies any suicidal thoughts, sleeping better, feels energetic and denies any anxiety problems.  She reports that she has stayed compliant to her medications and denied any side effects with the increase in Zoloft at the last appointment in August.  She reports that increasing Zoloft has been helpful.  She denies any medical issues in the interim since last appointment and denies taking any new medications since last appointment.  She denies any new psychosocial stressors and reports that she enjoys spending time with her family and they often have meals together and watch movies together.  We discussed to continue with Zoloft 100 mg once a day and trazodone 100 mg at bedtime and follow-up in 2 to 3 months or earlier if needed.  She verbalized understanding and agreed with the plan.  Visit Diagnosis:    ICD-10-CM   1. Generalized anxiety disorder  F41.1 sertraline (ZOLOFT) 100 MG tablet  2. Recurrent major depressive disorder, in partial remission (HCC)  F33.41 sertraline (ZOLOFT) 100 MG tablet    traZODone (DESYREL) 50 MG tablet    Past Psychiatric History: As mentioned in initial H&P, reviewed today, as following Patient has never been hospitalized psychiatrically, does not have any history of suicide, was previously seeing Dr. Lucianne Muss and then Dr. Karie Schwalbe at the [facility] outpatient clinic before transitioning to Dr. Daleen Bo and subsequently to this writer.  She is currently not in any counseling.  Past Medical History:  Past Medical History:  Diagnosis Date  . Anxiety   . Cerebral infarction involving right cerebellar artery (HCC)    intrauterine  . CP (cerebral palsy) (HCC)   . Epilepsy (HCC)   . Headache(784.0)   . Left hemiparesis (HCC)   . Seizures (HCC)      Past Surgical History:  Procedure Laterality Date  . NO PAST SURGERIES      Family Psychiatric History: Reviewed today and as following Paternal Uncle - Depression; Mother - Anxiety, reviewed today and no change.  Family History:  Family History  Problem Relation Age of Onset  . Migraines Maternal Grandfather   . Alcohol abuse Mother   . Anxiety disorder Father   . Alcohol abuse Father     Social History:  Social History   Socioeconomic History  . Marital status: Single    Spouse name: Not on file  . Number of children: Not on file  . Years of education: Not on file  . Highest education level: Not on file  Occupational History  . Not on file  Tobacco Use  . Smoking status: Never Smoker  . Smokeless tobacco: Never Used  Substance and Sexual Activity  . Alcohol use: No  . Drug use: No  . Sexual activity: Never  Other Topics Concern  . Not on file  Social History Narrative   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 She lives with her parents and siblings in separate households. She enjoys swimming, being with her family and eating ice cream.    Social Determinants of Health   Financial Resource Strain: Not on file  Food Insecurity: Not on file  Transportation Needs: Not on file  Physical Activity: Not on file  Stress: Not on file  Social Connections: Not on file    Allergies:  Allergies  Allergen Reactions  . Benadryl [Diphenhydramine] Other (See Comments)    seizures    Metabolic Disorder Labs: No results found for: HGBA1C, MPG No results found for: PROLACTIN No results found for: CHOL, TRIG, HDL, CHOLHDL, VLDL, LDLCALC Lab Results  Component Value Date   TSH 1.970 03/11/2018    Therapeutic Level Labs: No results found for: LITHIUM No results found for: VALPROATE No components found for:  CBMZ  Current Medications: Current Outpatient Medications  Medication Sig Dispense Refill  . NIKKI 3-0.02 MG tablet  TAKE 1 TABLET BY MOUTH EVERY DAY 28 tablet 77  . sertraline (ZOLOFT) 100 MG tablet Take 1 tablet (100 mg total) by mouth daily. 90 tablet 0  . traZODone (DESYREL) 50 MG tablet Take 2 tablets (100 mg total) by mouth at bedtime. 180 tablet 0   No current facility-administered medications for this visit.     Musculoskeletal:  Gait & Station:unable to assess since visit was over the telemedicine. Patient leans: unable to assess since visit was over the telemedicine.  Psychiatric Specialty Exam: ROSReview of 12 systems negative except as mentioned in HPI  There were no vitals taken for this visit.There is no height or weight on file to calculate BMI.  General Appearance: Unable to assess since the appointment was on telephone  Eye Contact:  Unable to assess since the appointment was on telephone  Speech:  Clear and Coherent and Normal Rate  Volume:  Normal  Mood:  "good"  Affect:  Unable to assess since the appointment was on telephone  Thought Process:  Goal Directed and Linear  Orientation:  Full (Time, Place, and Person)  Thought Content: Logical   Suicidal Thoughts:  No  Homicidal Thoughts:  No  Memory:  Immediate;   Fair Recent;   Fair Remote;   Fair  Judgement:  Fair  Insight:  Fair  Psychomotor Activity:  Normal  Concentration:  Concentration: Fair and Attention Span: Fair  Recall:  [employer] of Knowledge: Fair  Language: Fair  Akathisia:  NA  Handed:  Right  AIMS (if indicated): not done  Assets:  Desire for Improvement Financial Resources/Insurance Housing Social Support  ADL's:  Intact  Cognition: WNL  Sleep:  Fair   Screenings: PHQ2-9   Flowsheet Row Office Visit from 02/02/2020 in [facility] Office Visit from 10/18/2019 in [facility] Office Visit from 04/12/2019 in [facility] Office Visit from 03/11/2018 in [facility] Office Visit from 12/27/2017 in [facility]  PHQ-2 Total Score 4 5 0 0 0  PHQ-9 Total Score 14 14 -- 8 --       Assessment and Plan:   # Generalized Anxiety disorder- Chronic and stable - Continue with Zoloft 100 mg daily  # ADHD Chronic and improved - Stopped Dexedrine 2.5 mg BID - Pt does not feel the need to take it.   # Mood - recurrent and in remission - Continue with Zoloft 100mg  daily.   # Insomnia - Chronic and stable - Continue Trazodone 100mg  QHS  - Return to clinic in 6 weeks or early if needed.   , MD 09/06/2020, 8:48 AM

## 2020-10-06 ENCOUNTER — Other Ambulatory Visit: Payer: Self-pay | Admitting: Child and Adolescent Psychiatry

## 2020-10-22 ENCOUNTER — Other Ambulatory Visit: Payer: Managed Care, Other (non HMO)

## 2020-10-22 DIAGNOSIS — Z20822 Contact with and (suspected) exposure to covid-19: Secondary | ICD-10-CM | POA: Diagnosis not present

## 2020-10-23 LAB — NOVEL CORONAVIRUS, NAA: SARS-CoV-2, NAA: DETECTED — AB

## 2020-10-23 LAB — SARS-COV-2, NAA 2 DAY TAT

## 2020-10-31 ENCOUNTER — Telehealth: Payer: Self-pay | Admitting: Child and Adolescent Psychiatry

## 2020-11-15 ENCOUNTER — Telehealth: Payer: BC Managed Care – PPO | Admitting: Child and Adolescent Psychiatry

## 2020-11-17 ENCOUNTER — Other Ambulatory Visit: Payer: Self-pay | Admitting: Child and Adolescent Psychiatry

## 2020-11-17 DIAGNOSIS — F3341 Major depressive disorder, recurrent, in partial remission: Secondary | ICD-10-CM

## 2020-11-17 DIAGNOSIS — F411 Generalized anxiety disorder: Secondary | ICD-10-CM

## 2020-12-02 ENCOUNTER — Telehealth (INDEPENDENT_AMBULATORY_CARE_PROVIDER_SITE_OTHER): Payer: BC Managed Care – PPO | Admitting: Child and Adolescent Psychiatry

## 2020-12-02 ENCOUNTER — Other Ambulatory Visit: Payer: Self-pay

## 2020-12-02 DIAGNOSIS — F411 Generalized anxiety disorder: Secondary | ICD-10-CM

## 2020-12-02 DIAGNOSIS — F331 Major depressive disorder, recurrent, moderate: Secondary | ICD-10-CM

## 2020-12-02 MED ORDER — TRAZODONE HCL 50 MG PO TABS: 100.0000 mg | ORAL_TABLET | Freq: Every day | ORAL | 0 refills | Status: DC

## 2020-12-02 MED ORDER — SERTRALINE HCL 50 MG PO TABS: 150.0000 mg | ORAL_TABLET | Freq: Every day | ORAL | 0 refills | Status: DC
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
## 2020-12-09 ENCOUNTER — Ambulatory Visit: Payer: BC Managed Care – PPO | Admitting: Licensed Clinical Social Worker

## 2021-01-13 ENCOUNTER — Other Ambulatory Visit: Payer: Self-pay | Admitting: Child and Adolescent Psychiatry

## 2021-01-13 DIAGNOSIS — F411 Generalized anxiety disorder: Secondary | ICD-10-CM

## 2021-01-13 DIAGNOSIS — F331 Major depressive disorder, recurrent, moderate: Secondary | ICD-10-CM

## 2021-01-17 ENCOUNTER — Other Ambulatory Visit: Payer: Self-pay

## 2021-01-17 ENCOUNTER — Telehealth: Payer: BC Managed Care – PPO | Admitting: Child and Adolescent Psychiatry

## 2021-01-17 ENCOUNTER — Telehealth: Payer: Self-pay | Admitting: Child and Adolescent Psychiatry

## 2021-01-17 NOTE — Telephone Encounter (Signed)
Pt was sent link via text and email to connect on video for telemedicine encounter for scheduled appointment, and was also followed up with phone call. Pt did not connect on the video, and writer left the VM requesting to connect on the video or call back to reschedule appointment if they are not able to connect today for appointment.  Off note -  Home phone number is a wrong number.

## 2021-02-04 ENCOUNTER — Telehealth: Payer: Self-pay

## 2021-02-04 NOTE — Telephone Encounter (Signed)
Appointment - Patient left a message requesting a call back to set up her next appointment with Dr. Jerold Coombe, last seen 12/02/20, cancelled 12/09/20 and no showed 01/17/21.

## 2021-02-17 ENCOUNTER — Telehealth (INDEPENDENT_AMBULATORY_CARE_PROVIDER_SITE_OTHER): Payer: BC Managed Care – PPO | Admitting: Child and Adolescent Psychiatry

## 2021-02-17 ENCOUNTER — Other Ambulatory Visit: Payer: Self-pay

## 2021-02-17 DIAGNOSIS — F411 Generalized anxiety disorder: Secondary | ICD-10-CM

## 2021-02-17 DIAGNOSIS — F3341 Major depressive disorder, recurrent, in partial remission: Secondary | ICD-10-CM

## 2021-02-17 MED ORDER — SERTRALINE HCL 50 MG PO TABS: 150.0000 mg | ORAL_TABLET | Freq: Every day | ORAL | 0 refills | Status: DC

## 2021-02-17 MED ORDER — TRAZODONE HCL 50 MG PO TABS: 100.0000 mg | ORAL_TABLET | Freq: Every day | ORAL | 0 refills | Status: DC

## 2021-02-17 NOTE — Progress Notes (Signed)
Virtual Visit via Video Note  I connected with Christina Mccall on 02/17/21 at  3:00 PM EDT by a video enabled telemedicine application and verified that I am speaking with the correct person using two identifiers.  Location: Patient: home Provider: office   I discussed the limitations of evaluation and management by telemedicine and the availability of in person appointments. The patient expressed understanding and agreed to proceed.   I discussed the assessment and treatment plan with the patient. The patient was provided an opportunity to ask questions and all were answered. The patient agreed with the plan and demonstrated an understanding of the instructions.   The patient was advised to call back or seek an in-person evaluation if the symptoms worsen or if the condition fails to improve as anticipated.  I provided 15 minutes of non-face-to-face time during this encounter.   Darcel Smalling, MD   [facility] MD/PA/NP OP Progress Note  02/17/2021 3:18 PM Christina Mccall  MRN:  [id number]  Chief Complaint: Medication management follow-up for mood and anxiety.    Synopsis: This is a 20 year old Caucasian female with medical history significant of left hemiparesis since 2002, generalized epilepsy and psychiatric history significant of mood disorder, generalized anxiety disorder and ADHD has been under outpatient psychiatric care at [facility] since 2015, and med trials include Effexor XR, Lamictal, Abilify, Topamax in the past, currently on Zoloft and Trazodone.   HPI:   Christina Mccall was seen and evaluated over telemedicine encounter for medication management follow-up.  She was present by herself at her grandmother's home where she has been living since past few months and was evaluated alone.  She was last seen about 2-1/2 months ago and had 1 no-show in the interim.  At her last appointment she was recommended to increase the dose of Zoloft to 150 mg once a day.  Today she reports that she has  tolerated increased dose of Zoloft well without any side effects and noticed improvement with her mood and anxiety.  She denies feeling depressed or having any low lows, reports that her mood has been "pretty good", denies anhedonia, denies problems with sleep or appetite, denies any problems with energy, denies any thoughts of suicide or self-harm.  She reports that her anxiety has also improved a lot.  She reports that she continues to work at [employer] and works there about 15 hours or less.  She reports that in her free time she has been playing video games on iPad or hanging out with her grandmother.  She reports that she also been getting along well with her mother.  She reports that she has been compliant to her medications.  We discussed that given her improvement in her symptoms would recommend continuing with current treatment.  She verbalized understanding and agreed with the plan.  She will follow up in 3 months or earlier if needed.   Visit Diagnosis:    ICD-10-CM   1. Recurrent major depressive disorder, in partial remission (HCC)  F33.41 traZODone (DESYREL) 50 MG tablet    sertraline (ZOLOFT) 50 MG tablet  2. Generalized anxiety disorder  F41.1 sertraline (ZOLOFT) 50 MG tablet    Past Psychiatric History: As mentioned in initial H&P, reviewed today, as following Patient has never been hospitalized psychiatrically, does not have any history of suicide, was previously seeing Dr. Lucianne Muss and then Dr. Karie Schwalbe at the [facility] outpatient clinic before transitioning to Dr. Daleen Bo and subsequently to this writer.  She is currently not in any psychotherapy.  Past Medical History:  Past Medical History:  Diagnosis Date  . Anxiety   . Cerebral infarction involving right cerebellar artery (HCC)    intrauterine  . CP (cerebral palsy) (HCC)   . Epilepsy (HCC)   . Headache(784.0)   . Left hemiparesis (HCC)   . Seizures (HCC)     Past Surgical History:  Procedure Laterality Date  . NO PAST  SURGERIES      Family Psychiatric History: Reviewed today and as following Paternal Uncle - Depression; Mother - Anxiety, reviewed today and no change.  Family History:  Family History  Problem Relation Age of Onset  . Migraines Maternal Grandfather   . Alcohol abuse Mother   . Anxiety disorder Father   . Alcohol abuse Father     Social History:  Social History   Socioeconomic History  . Marital status: Single    Spouse name: Not on file  . Number of children: Not on file  . Years of education: Not on file  . Highest education level: Not on file  Occupational History  . Not on file  Tobacco Use  . Smoking status: Never Smoker  . Smokeless tobacco: Never Used  Substance and Sexual Activity  . Alcohol use: No  . Drug use: No  . Sexual activity: Never  Other Topics Concern  . Not on file  Social History Narrative   Aashvi is a 9th grade student at [employer]; she does well in school. She lives with her parents and siblings in separate households. She enjoys swimming, being with her family and eating ice cream.    Social Determinants of Health   Financial Resource Strain: Not on file  Food Insecurity: Not on file  Transportation Needs: Not on file  Physical Activity: Not on file  Stress: Not on file  Social Connections: Not on file    Allergies:  Allergies  Allergen Reactions  . Benadryl [Diphenhydramine] Other (See Comments)    seizures    Metabolic Disorder Labs: No results found for: HGBA1C, MPG No results found for: PROLACTIN No results found for: CHOL, TRIG, HDL, CHOLHDL, VLDL, LDLCALC Lab Results  Component Value Date   TSH 1.970 03/11/2018    Therapeutic Level Labs: No results found for: LITHIUM No results found for: VALPROATE No components found for:  CBMZ  Current Medications: Current Outpatient Medications  Medication Sig Dispense Refill  . NIKKI 3-0.02 MG tablet TAKE 1 TABLET BY MOUTH EVERY DAY 28 tablet 77  . sertraline  (ZOLOFT) 50 MG tablet Take 3 tablets (150 mg total) by mouth daily. 270 tablet 0  . traZODone (DESYREL) 50 MG tablet Take 2 tablets (100 mg total) by mouth at bedtime. 180 tablet 0   No current facility-administered medications for this visit.     Musculoskeletal:  Gait & Station:unable to assess since visit was over the telemedicine. Patient leans: unable to assess since visit was over the telemedicine.  Psychiatric Specialty Exam: ROSReview of 12 systems negative except as mentioned in HPI  There were no vitals taken for this visit.There is no height or weight on file to calculate BMI.  General Appearance: Casual  Eye Contact:  Fair  Speech:  Clear and Coherent and Normal Rate  Volume:  Normal  Mood:  "good"  Affect:  Appropriate, Congruent and Full Range  Thought Process:  Goal Directed and Linear  Orientation:  Full (Time, Place, and Person)  Thought Content: Logical   Suicidal Thoughts:  No  Homicidal Thoughts:  No  Memory:  Immediate;   Fair Recent;   Fair Remote;   Fair  Judgement:  Fair  Insight:  Fair  Psychomotor Activity:  Normal  Concentration:  Concentration: Fair and Attention Span: Fair  Recall:  [employer] of Knowledge: Fair  Language: Fair  Akathisia:  NA  Handed:  Right  AIMS (if indicated): not done  Assets:  Desire for Improvement Financial Resources/Insurance Housing Social Support  ADL's:  Intact  Cognition: WNL  Sleep:  Fair   Screenings: PHQ2-9   Flowsheet Row Video Visit from 02/17/2021 in [facility] Video Visit from 12/02/2020 in [facility] Office Visit from 02/02/2020 in [facility] Office Visit from 10/18/2019 in [facility] Office Visit from 04/12/2019 in [facility]  PHQ-2 Total Score 0 6 4 5  0  PHQ-9 Total Score -- 16 14 14  --       Assessment and Plan:   # Depression - recurrent and in remission - Continue  with Zoloft 150mg  daily.  - Therapy referral at [facility]  # Generalized Anxiety disorder- Chronic and stable - As mentioned above for depression  # ADHD Chronic and improved - Stopped Dexedrine 2.5 mg BID - Pt does not feel the need to take it.   # Insomnia - Chronic and stable - Continue Trazodone 100mg  QHS  - Return to clinic in 3 months or early if needed.   MDM = 2 or more chronic stable conditions + med management , MD 02/17/2021, 3:18 PM

## 2021-03-18 ENCOUNTER — Other Ambulatory Visit: Payer: Self-pay | Admitting: Child and Adolescent Psychiatry

## 2021-03-18 DIAGNOSIS — F3341 Major depressive disorder, recurrent, in partial remission: Secondary | ICD-10-CM

## 2021-04-29 ENCOUNTER — Other Ambulatory Visit: Payer: Self-pay | Admitting: Child and Adolescent Psychiatry

## 2021-04-29 DIAGNOSIS — F411 Generalized anxiety disorder: Secondary | ICD-10-CM

## 2021-04-29 DIAGNOSIS — F3341 Major depressive disorder, recurrent, in partial remission: Secondary | ICD-10-CM

## 2021-05-13 ENCOUNTER — Telehealth (INDEPENDENT_AMBULATORY_CARE_PROVIDER_SITE_OTHER): Payer: BC Managed Care – PPO | Admitting: Child and Adolescent Psychiatry

## 2021-05-13 ENCOUNTER — Other Ambulatory Visit: Payer: Self-pay

## 2021-05-13 DIAGNOSIS — F3341 Major depressive disorder, recurrent, in partial remission: Secondary | ICD-10-CM | POA: Diagnosis not present

## 2021-05-13 DIAGNOSIS — F411 Generalized anxiety disorder: Secondary | ICD-10-CM

## 2021-05-13 MED ORDER — SERTRALINE HCL 100 MG PO TABS: 200.0000 mg | ORAL_TABLET | Freq: Every day | ORAL | 1 refills | Status: DC

## 2021-05-13 MED ORDER — TRAZODONE HCL 50 MG PO TABS: 100.0000 mg | ORAL_TABLET | Freq: Every day | ORAL | 0 refills | Status: DC

## 2021-05-13 NOTE — Progress Notes (Signed)
Virtual Visit via Video Note  I connected with Christina Mccall on 05/13/21 at  2:30 PM EDT by a video enabled telemedicine application and verified that I am speaking with the correct person using two identifiers.  Location: Patient: home Provider: office   I discussed the limitations of evaluation and management by telemedicine and the availability of in person appointments. The patient expressed understanding and agreed to proceed.   I discussed the assessment and treatment plan with the patient. The patient was provided an opportunity to ask questions and all were answered. The patient agreed with the plan and demonstrated an understanding of the instructions.   The patient was advised to call back or seek an in-person evaluation if the symptoms worsen or if the condition fails to improve as anticipated.  I provided 17 minutes of non-face-to-face time during this encounter.   Darcel Smalling, MD   [facility] MD/PA/NP OP Progress Note  05/13/2021 2:53 PM Christina Mccall  MRN:  [id number]  Chief Complaint: Medication management follow-up for mood and anxiety.   Synopsis: This is a 20 year old Caucasian female with medical history significant of left hemiparesis since 2002, generalized epilepsy and psychiatric history significant of mood disorder, generalized anxiety disorder and ADHD has been under outpatient psychiatric care at [facility] since 2015, and med trials include Effexor XR, Lamictal, Abilify, Topamax in the past, currently on Zoloft and Trazodone.   HPI:   Caress was seen and evaluated over telemedicine encounter for medication management follow-up.  She was present by herself at her mother's home and was evaluated separately.  She was last seen about 3 months ago and was recommended to continue with Zoloft 150 mg once a day along with trazodone 100 mg at night for sleep.  Today she reports that she has been feeling more depressed since last 2 months and it has worsened since last 2  weeks after she found out that her mother has cancer.  She reports that after she found out that her mother has cancer she had suicidal thought and superficially cut herself.  She reports that she stopped herself because she thought about her family.  She reports that she has suicidal thoughts about once a month and describes them as passive suicidal thoughts.  When asked her to describe her depression she reports that she feels sad, anhedonic, tired, lack of appetite.  She denies any problems with sleep.  She reports that she is often depressed because she is not able to drive, or do other independent activities because of her disabilities.  She denies excessive worries or anxiety.  We discussed to increase the dose of Zoloft to 200 mg once a day and also recommended therapy as medications and therapy would be most beneficial for the treatment for her depression.  She verbalized understanding.  I will resend the referral to [facility] or [facility] office.  She will follow back again in 6 weeks or earlier if needed    Visit Diagnosis:    ICD-10-CM   1. Recurrent major depressive disorder, in partial remission (HCC)  F33.41 sertraline (ZOLOFT) 100 MG tablet    traZODone (DESYREL) 50 MG tablet    2. Generalized anxiety disorder  F41.1 sertraline (ZOLOFT) 100 MG tablet      Past Psychiatric History: As mentioned in initial H&P, reviewed today, as following Patient has never been hospitalized psychiatrically, does not have any history of suicide, was previously seeing Dr. Lucianne Muss and then Dr. Karie Schwalbe at the [facility] outpatient clinic before transitioning to  Dr. Daleen Bo and subsequently to this writer.  She is currently not in any psychotherapy.   Past Medical History:  Past Medical History:  Diagnosis Date   Anxiety    Cerebral infarction involving right cerebellar artery (HCC)    intrauterine   CP (cerebral palsy) (HCC)    Epilepsy (HCC)    Headache(784.0)    Left hemiparesis (HCC)    Seizures (HCC)      Past Surgical History:  Procedure Laterality Date   NO PAST SURGERIES      Family Psychiatric History: Reviewed today and as following Paternal Uncle - Depression; Mother - Anxiety, reviewed today and no change.  Family History:  Family History  Problem Relation Age of Onset   Migraines Maternal Grandfather    Alcohol abuse Mother    Anxiety disorder Father    Alcohol abuse Father     Social History:  Social History   Socioeconomic History   Marital status: Single    Spouse name: Not on file   Number of children: Not on file   Years of education: Not on file   Highest education level: Not on file  Occupational History   Not on file  Tobacco Use   Smoking status: Never   Smokeless tobacco: Never  Substance and Sexual Activity   Alcohol use: No   Drug use: No   Sexual activity: Never  Other Topics Concern   Not on file  Social History Narrative   Timber is a 9th grade student at [employer]; she does well in school. She lives with her parents and siblings in separate households. She enjoys swimming, being with her family and eating ice cream.    Social Determinants of Health   Financial Resource Strain: Not on file  Food Insecurity: Not on file  Transportation Needs: Not on file  Physical Activity: Not on file  Stress: Not on file  Social Connections: Not on file    Allergies:  Allergies  Allergen Reactions   Benadryl [Diphenhydramine] Other (See Comments)    seizures    Metabolic Disorder Labs: No results found for: HGBA1C, MPG No results found for: PROLACTIN No results found for: CHOL, TRIG, HDL, CHOLHDL, VLDL, LDLCALC Lab Results  Component Value Date   TSH 1.970 03/11/2018    Therapeutic Level Labs: No results found for: LITHIUM No results found for: VALPROATE No components found for:  CBMZ  Current Medications: Current Outpatient Medications  Medication Sig Dispense Refill   NIKKI 3-0.02 MG tablet TAKE 1 TABLET BY MOUTH EVERY DAY  28 tablet 77   sertraline (ZOLOFT) 100 MG tablet Take 2 tablets (200 mg total) by mouth daily. 60 tablet 1   traZODone (DESYREL) 50 MG tablet Take 2 tablets (100 mg total) by mouth at bedtime. 180 tablet 0   No current facility-administered medications for this visit.     Musculoskeletal:  Gait & Station:unable to assess since visit was over the telemedicine. Patient leans: unable to assess since visit was over the telemedicine.  Psychiatric Specialty Exam: ROSReview of 12 systems negative except as mentioned in HPI  There were no vitals taken for this visit.There is no height or weight on file to calculate BMI.  General Appearance: Casual  Eye Contact:  Fair  Speech:  Clear and Coherent and Normal Rate  Volume:  Normal  Mood:   "depressed.."  Affect:  Appropriate, Congruent, and Restricted  Thought Process:  Goal Directed and Linear  Orientation:  Full (Time, Place, and Person)  Thought Content: Logical   Suicidal Thoughts:  No  Homicidal Thoughts:  No  Memory:  Immediate;   Fair Recent;   Fair Remote;   Fair  Judgement:  Fair  Insight:  Fair  Psychomotor Activity:  Normal  Concentration:  Concentration: Fair and Attention Span: Fair  Recall:  [employer] of Knowledge: Fair  Language: Fair  Akathisia:  NA  Handed:  Right  AIMS (if indicated): not done  Assets:  Desire for Improvement Financial Resources/Insurance Housing Social Support  ADL's:  Intact  Cognition: WNL  Sleep:  Fair   Screenings: PHQ2-9    Flowsheet Row Video Visit from 02/17/2021 in [facility] Video Visit from 12/02/2020 in [facility] Office Visit from 02/02/2020 in [facility] Office Visit from 10/18/2019 in [facility] Office Visit from 04/12/2019 in [facility]  PHQ-2 Total Score 0 6 4 5  0  PHQ-9 Total Score -- 16 14 14  --        Assessment and Plan:   # Depression -  recurrent and in remission - Increase Zoloft 200mg  daily.  - Therapy referral at [facility]  # Generalized Anxiety disorder- Chronic and stable - As mentioned above for depression  # ADHD Chronic and improved - Stopped Dexedrine 2.5 mg BID - Pt does not feel the need to take it.   # Insomnia - Chronic and stable - Continue Trazodone 100mg  QHS  - Return to clinic in 3 months or early if needed.   MDM = 2 or more chronic stable conditions + med management , MD 05/13/2021, 2:53 PM

## 2021-05-21 ENCOUNTER — Ambulatory Visit (HOSPITAL_COMMUNITY): Payer: Self-pay | Admitting: Clinical

## 2021-05-27 ENCOUNTER — Other Ambulatory Visit: Payer: Self-pay | Admitting: Child and Adolescent Psychiatry

## 2021-05-27 DIAGNOSIS — F3341 Major depressive disorder, recurrent, in partial remission: Secondary | ICD-10-CM

## 2021-05-27 DIAGNOSIS — F411 Generalized anxiety disorder: Secondary | ICD-10-CM

## 2021-06-11 ENCOUNTER — Ambulatory Visit: Payer: BC Managed Care – PPO | Admitting: Family Medicine

## 2021-06-17 ENCOUNTER — Other Ambulatory Visit: Payer: Self-pay

## 2021-06-17 ENCOUNTER — Encounter: Payer: Self-pay | Admitting: Family Medicine

## 2021-06-17 ENCOUNTER — Ambulatory Visit (INDEPENDENT_AMBULATORY_CARE_PROVIDER_SITE_OTHER): Payer: BC Managed Care – PPO | Admitting: Family Medicine

## 2021-06-17 VITALS — BP 103/63 | HR 111 | Wt 156.4 lb

## 2021-06-17 DIAGNOSIS — Z308 Encounter for other contraceptive management: Secondary | ICD-10-CM

## 2021-06-17 DIAGNOSIS — Z309 Encounter for contraceptive management, unspecified: Secondary | ICD-10-CM | POA: Diagnosis not present

## 2021-06-17 LAB — POCT URINE PREGNANCY: Preg Test, Ur: NEGATIVE

## 2021-06-17 MED ORDER — NORETHINDRONE 0.35 MG PO TABS
1.0000 | ORAL_TABLET | Freq: Every day | ORAL | 11 refills | Status: DC
Start: 2021-06-17 — End: 2022-03-09

## 2021-06-17 NOTE — Progress Notes (Signed)
    SUBJECTIVE:   CHIEF COMPLAINT / HPI:   Patient presents for restarting contraception. She was previously on combination pill and last took it 2-3 weeks ago and needs refills. She initially started to take it for menorrhagia which has resolved but now taking it because she is sexually active with 1 female partner who is her boyfriend. She has been using protection while not on the pill but does not typically otherwise. History of cerebral embolism with infarction per chart review. Patient endorses history of migraines without aura and smokes THC but denies tobacco use.    Patient denies SI currently but states that she has had recent stressors arise. She found out that her mother was diagnosed with stage 3 colon cancer and this has been very difficult for her as she has had multiple family members diagnosed with various types of cancer. She has been experiencing intermittent SI about twice a month recently but reports that she would never act on her thoughts because she does not want to do that to her family and boyfriend. She denies having a plan in place. She is planning on moving in with her boyfriend next year. Has previously participated in therapy but not currently. She politely declines therapy at this time.   OBJECTIVE:   BP 103/63   Pulse (!) 111   Wt 156 lb 6.4 oz (70.9 kg)   SpO2 98%   BMI 26.03 kg/m   General: Patient well-appearing, in no acute distress. CV: RRR, no murmurs or gallops auscultated Resp: CTAB Abdomen: soft, non-tender, nondistended, presence of bowel sounds Ext: radial pulses strong and equal bilaterally, no LE edema noted bilaterally Psych: denies SI or HI, denies plan or intent  ASSESSMENT/PLAN:   Contraception management -Upreg negative  -given history of infarct, switched contraception from combination to progesterone-only pill. Discussed this with patient and she voices understanding and agrees with current plan. -discussed instructions and importance  of daily compliance -encouraged use of protection as contraception does not protect against STIs     -PHQ-9 reviewed and discussed. Patient's total score of 11 with 1 for question 9. Reassuringly patient denies SI and has strong protective factors. Suicide help line and reassurance provided. Encourage therapy when able as patient politely declined at today's visit.  Instructed to follow up in 2 weeks for mood check.   Reece Leader, DO [facility]

## 2021-06-17 NOTE — Patient Instructions (Addendum)
It was great seeing you today!  Today we discussed birth control, I have started you on a new birth control pill that is safer. Please take this daily and remember to try to take it at the same time daily. Contraception only works to prevent against pregnancy but does not prevent the transmission of sexually transmitted infections so please remember to use protection with each encounter.   Please follow up at your next scheduled appointment in 2 weeks, if anything arises between now and then, please don't hesitate to contact our office.   Thank you for allowing [place] to be a part of your medical care!  Thank you, Dr. Robyne Peers    CALL [phone]  If you are feeling suicidal or depression symptoms worsen please immediately go to:   If you are thinking about harming yourself or having thoughts of suicide, or if you know someone who is, seek help right away. If you are in crisis, make sure you are not left alone.  If someone else is in crisis, make sure he/she/they is not left alone  Call 988 OR 1-800-273-TALK  24 Hour Availability for Walk-IN services  [facility]  [address] [place], [place] [phone] Crisis [phone]    Other crisis resources:  [facility] [employer] (Domestic Violence, Rape & Victim Assistance [phone]  [facility]    (ONLY from 8am-4pm)    [phone]  [facility] (24/7)   [phone]  [place] National Suicide Hotline   [phone] Len Childs)

## 2021-06-17 NOTE — Assessment & Plan Note (Signed)
-  Upreg negative  -given history of infarct, switched contraception from combination to progesterone-only pill. Discussed this with patient and she voices understanding and agrees with current plan. -discussed instructions and importance of daily compliance -encouraged use of protection as contraception does not protect against STIs

## 2021-06-23 ENCOUNTER — Other Ambulatory Visit: Payer: Self-pay | Admitting: Child and Adolescent Psychiatry

## 2021-06-23 DIAGNOSIS — F3341 Major depressive disorder, recurrent, in partial remission: Secondary | ICD-10-CM

## 2021-06-26 NOTE — Telephone Encounter (Signed)
CLOSED

## 2021-09-10 ENCOUNTER — Telehealth: Payer: BC Managed Care – PPO | Admitting: Child and Adolescent Psychiatry

## 2021-09-10 ENCOUNTER — Other Ambulatory Visit: Payer: Self-pay

## 2021-09-10 ENCOUNTER — Telehealth: Payer: Self-pay | Admitting: Child and Adolescent Psychiatry

## 2021-09-10 NOTE — Telephone Encounter (Signed)
Pt was sent link via text and email to connect on video for telemedicine encounter for scheduled appointment, and was also followed up with phone call. Pt did not connect on the video, and writer left the VM requesting to connect on the video or call back to reschedule appointment if they are not able to connect today for appointment.   

## 2021-09-17 ENCOUNTER — Other Ambulatory Visit: Payer: Self-pay

## 2021-09-17 ENCOUNTER — Telehealth (INDEPENDENT_AMBULATORY_CARE_PROVIDER_SITE_OTHER): Payer: BC Managed Care – PPO | Admitting: Child and Adolescent Psychiatry

## 2021-09-17 DIAGNOSIS — F411 Generalized anxiety disorder: Secondary | ICD-10-CM | POA: Diagnosis not present

## 2021-09-17 DIAGNOSIS — F331 Major depressive disorder, recurrent, moderate: Secondary | ICD-10-CM

## 2021-09-17 NOTE — Progress Notes (Signed)
Virtual Visit via Video Note  I connected with Christina Mccall on 09/17/21 at  1:30 PM EST by a video enabled telemedicine application and verified that I am speaking with the correct person using two identifiers.  Location: Patient: home Provider: office   I discussed the limitations of evaluation and management by telemedicine and the availability of in person appointments. The patient expressed understanding and agreed to proceed.   I discussed the assessment and treatment plan with the patient. The patient was provided an opportunity to ask questions and all were answered. The patient agreed with the plan and demonstrated an understanding of the instructions.   The patient was advised to call back or seek an in-person evaluation if the symptoms worsen or if the condition fails to improve as anticipated.  I provided 17 minutes of non-face-to-face time during this encounter.   Darcel Smalling, MD   [facility] MD/PA/NP OP Progress Note  09/17/2021 2:00 PM Christina Mccall  MRN:  [id number]  Chief Complaint:  Medication management follow-up for depression, anxiety.   Synopsis: This is a 20 year old Caucasian female with medical history significant of left hemiparesis since 2002, generalized epilepsy and psychiatric history significant of mood disorder, generalized anxiety disorder and ADHD has been under outpatient psychiatric care at [facility] since 2015, and med trials include Effexor XR, Lamictal, Abilify, Topamax in the past, currently on Zoloft and Trazodone.   HPI:   Christina Mccall was seen and evaluated over telemedicine encounter for medication management follow-up.  She was present by herself at her sister's home and was evaluated alone.  She was last seen about 4 to 5 months ago and was recommended to increase the dose of Zoloft to 200 mg once a day.  Since then she has canceled for no showed for her follow-up appointments and presents today for follow-up appointment.  She reports that she has  been feeling more depressed over the last few months.  She reports that she feels sad most of the time, wanting to cry.  She reports that her depression is in the context of not being able to drive or others saying that she is a burden as she cannot be independent because of her neurological issues.  She reports that she sleeps well, has low appetite, and likes to talk to her friends.  She reports that she has intermittent suicidal thoughts occurring about once a week, plans crosses her mind but she does not have any intention to act on them and usually thinking about her family and friends stops her from acting on these thoughts.  She reports that she would talk to her friends, her family or call [phone] if she does not feel safe.  She reports that she is only taking Zoloft 100 mg instead of prescribed 200 mg once a day due to her misunderstanding.  We discussed to increase the dose of Zoloft to 150 mg once a day for depression.  She verbalized understanding and agreed with the plan.  We also discussed recommendation for therapy, discussed that previously she was referred to therapy at [facility] office and she canceled her appointment.  Discussed that she follow up with the recommendation for therapy follow-up.  I have resent the request to make a therapy appointment for patient at [facility] office.  She will follow back again in a month or earlier if needed.  Visit Diagnosis:    ICD-10-CM   1. Moderate episode of recurrent major depressive disorder (HCC)  F33.1     2. Generalized anxiety  disorder  F41.1        Past Psychiatric History: As mentioned in initial H&P, reviewed today, as following Patient has never been hospitalized psychiatrically, does not have any history of suicide, was previously seeing Dr. Lucianne Muss and then Dr. Karie Schwalbe at the [facility] outpatient clinic before transitioning to Dr. Daleen Bo and subsequently to this writer.  She is currently not in any psychotherapy.   Past Medical History:   Past Medical History:  Diagnosis Date   Anxiety    Cerebral infarction involving right cerebellar artery (HCC)    intrauterine   CP (cerebral palsy) (HCC)    Epilepsy (HCC)    Headache(784.0)    Left hemiparesis (HCC)    Seizures (HCC)     Past Surgical History:  Procedure Laterality Date   NO PAST SURGERIES      Family Psychiatric History: Reviewed today and as following Paternal Uncle - Depression; Mother - Anxiety, reviewed today and no change.  Family History:  Family History  Problem Relation Age of Onset   Migraines Maternal Grandfather    Alcohol abuse Mother    Anxiety disorder Father    Alcohol abuse Father     Social History:  Social History   Socioeconomic History   Marital status: Single    Spouse name: Not on file   Number of children: Not on file   Years of education: Not on file   Highest education level: Not on file  Occupational History   Not on file  Tobacco Use   Smoking status: Never   Smokeless tobacco: Never  Substance and Sexual Activity   Alcohol use: No   Drug use: No   Sexual activity: Never  Other Topics Concern   Not on file  Social History Narrative   Rahmah is a 9th grade student at [employer]; she does well in school. She lives with her parents and siblings in separate households. She enjoys swimming, being with her family and eating ice cream.    Social Determinants of Health   Financial Resource Strain: Not on file  Food Insecurity: Not on file  Transportation Needs: Not on file  Physical Activity: Not on file  Stress: Not on file  Social Connections: Not on file    Allergies:  Allergies  Allergen Reactions   Benadryl [Diphenhydramine] Other (See Comments)    seizures    Metabolic Disorder Labs: No results found for: HGBA1C, MPG No results found for: PROLACTIN No results found for: CHOL, TRIG, HDL, CHOLHDL, VLDL, LDLCALC Lab Results  Component Value Date   TSH 1.970 03/11/2018    Therapeutic Level  Labs: No results found for: LITHIUM No results found for: VALPROATE No components found for:  CBMZ  Current Medications: Current Outpatient Medications  Medication Sig Dispense Refill   norethindrone (ORTHO MICRONOR) 0.35 MG tablet Take 1 tablet (0.35 mg total) by mouth daily. 28 tablet 11   sertraline (ZOLOFT) 100 MG tablet TAKE 2 TABLETS BY MOUTH EVERY DAY 180 tablet 1   traZODone (DESYREL) 50 MG tablet Take 2 tablets (100 mg total) by mouth at bedtime. 180 tablet 0   No current facility-administered medications for this visit.     Musculoskeletal:  Gait & Station:unable to assess since visit was over the telemedicine. Patient leans: unable to assess since visit was over the telemedicine.  Psychiatric Specialty Exam: ROSReview of 12 systems negative except as mentioned in HPI  There were no vitals taken for this visit.There is no height or weight on  file to calculate BMI.  General Appearance: Casual  Eye Contact:  Fair  Speech:  Clear and Coherent and Normal Rate  Volume:  Normal  Mood:   "depressed.."  Affect:  Appropriate, Congruent, and Restricted  Thought Process:  Goal Directed and Linear  Orientation:  Full (Time, Place, and Person)  Thought Content: Logical   Suicidal Thoughts:  No  Homicidal Thoughts:  No  Memory:  Immediate;   Fair Recent;   Fair Remote;   Fair  Judgement:  Fair  Insight:  Fair  Psychomotor Activity:  Normal  Concentration:  Concentration: Fair and Attention Span: Fair  Recall:  [employer] of Knowledge: Fair  Language: Fair  Akathisia:  NA  Handed:  Right  AIMS (if indicated): not done  Assets:  Desire for Improvement Financial Resources/Insurance Housing Social Support  ADL's:  Intact  Cognition: WNL  Sleep:  Fair   Screenings: PHQ2-9    Flowsheet Row Office Visit from 06/17/2021 in [facility] Video Visit from 02/17/2021 in [facility] Video Visit from 12/02/2020 in [facility] Office Visit from 02/02/2020 in [facility] Office Visit from 10/18/2019 in [facility]  PHQ-2 Total Score 1 0 6 4 5   PHQ-9 Total Score 11 -- 16 14 14         Assessment and Plan:   # Depression - recurrent and moderate - Increase Zoloft 150mg  daily, he has been taking Zoloft 100 mg once a day. - Therapy referral to [facility] outpatient.   # Generalized Anxiety disorder- Chronic and stable - As mentioned above for depression  # ADHD Chronic and improved - Stopped Dexedrine 2.5 mg BID - Pt does not feel the need to take it.   # Insomnia - Chronic and stable - Continue Trazodone 100mg  QHS  - Return to clinic in 1 month or early if needed.   MDM = 2 or more chronic conditions + med management , MD 09/17/2021, 2:00 PM

## 2021-09-22 ENCOUNTER — Other Ambulatory Visit: Payer: Self-pay | Admitting: Child and Adolescent Psychiatry

## 2021-09-22 DIAGNOSIS — F3341 Major depressive disorder, recurrent, in partial remission: Secondary | ICD-10-CM

## 2021-10-06 ENCOUNTER — Other Ambulatory Visit: Payer: Self-pay

## 2021-10-06 ENCOUNTER — Ambulatory Visit (HOSPITAL_COMMUNITY): Payer: BC Managed Care – PPO | Admitting: Clinical

## 2021-10-16 ENCOUNTER — Other Ambulatory Visit: Payer: Self-pay | Admitting: Child and Adolescent Psychiatry

## 2021-10-16 DIAGNOSIS — F3341 Major depressive disorder, recurrent, in partial remission: Secondary | ICD-10-CM

## 2021-10-22 ENCOUNTER — Telehealth: Payer: Self-pay | Admitting: Child and Adolescent Psychiatry

## 2021-10-22 ENCOUNTER — Telehealth: Payer: BC Managed Care – PPO | Admitting: Child and Adolescent Psychiatry

## 2021-10-22 ENCOUNTER — Other Ambulatory Visit: Payer: Self-pay

## 2021-10-22 NOTE — Telephone Encounter (Signed)
Pt was sent link via text and email to connect on video for telemedicine encounter for scheduled appointment, and was also followed up with phone call. Pt did not connect on the video, no option to leave VM.

## 2022-03-03 ENCOUNTER — Encounter: Payer: Self-pay | Admitting: *Deleted

## 2022-03-08 ENCOUNTER — Other Ambulatory Visit: Payer: Self-pay | Admitting: Family Medicine

## 2022-03-08 DIAGNOSIS — Z309 Encounter for contraceptive management, unspecified: Secondary | ICD-10-CM

## 2022-03-09 NOTE — Telephone Encounter (Signed)
Need to see patient before next refill, last seen 05/2021.

## 2022-04-08 ENCOUNTER — Telehealth: Payer: BC Managed Care – PPO | Admitting: Child and Adolescent Psychiatry

## 2022-04-08 ENCOUNTER — Telehealth: Payer: Self-pay | Admitting: Child and Adolescent Psychiatry

## 2022-04-08 NOTE — Telephone Encounter (Signed)
Ok, thanks.

## 2022-04-08 NOTE — Telephone Encounter (Signed)
Patient called and  requested appointment with provider. Has not been seen since December 2022 (Virtually) and not seen in-office in over 2 years. Scheduled virtual  appointment due to transportation issues, with the provision that provider would be contacted and would likely want to have an in-person visit. Provider contacted and wants to have an in-person visit. Attempted to call patient back X 2 with no answer and no VM set-up. Will send MyChart message and email as well to cancel and request call back to reschedule.

## 2022-04-10 ENCOUNTER — Ambulatory Visit: Payer: BC Managed Care – PPO | Admitting: Family Medicine

## 2022-04-15 ENCOUNTER — Ambulatory Visit (INDEPENDENT_AMBULATORY_CARE_PROVIDER_SITE_OTHER): Payer: BC Managed Care – PPO | Admitting: Child and Adolescent Psychiatry

## 2022-04-15 ENCOUNTER — Encounter: Payer: Self-pay | Admitting: Child and Adolescent Psychiatry

## 2022-04-15 DIAGNOSIS — F3341 Major depressive disorder, recurrent, in partial remission: Secondary | ICD-10-CM | POA: Diagnosis not present

## 2022-04-15 DIAGNOSIS — F411 Generalized anxiety disorder: Secondary | ICD-10-CM | POA: Diagnosis not present

## 2022-04-15 MED ORDER — TRAZODONE HCL 50 MG PO TABS: 100.0000 mg | ORAL_TABLET | Freq: Every day | ORAL | 1 refills | Status: DC

## 2022-04-15 NOTE — Progress Notes (Signed)
BH MD/PA/NP OP Progress Note  04/15/2022 5:00 PM Christina Mccall  MRN:  [id number]  Chief Complaint:  Medication management follow-up for depression, anxiety. Chief Complaint   Follow-up     Synopsis: This is a 21 year old Caucasian female with medical history significant of left hemiparesis since 2002, generalized epilepsy and psychiatric history significant of mood disorder, generalized anxiety disorder and ADHD has been under outpatient psychiatric care at [facility] since 2015, and med trials include Effexor XR, Lamictal, Abilify, Topamax in the past, currently on Zoloft and Trazodone.   HPI:   Christina Mccall was seen and evaluated in office for medication management follow-up appointment.  She was last seen in December 2022, had no-show subsequently, recently reached out to schedule a follow-up appointment.  At her last appointment she was recommended Zoloft 150 mg once a day and trazodone 100 mg at night for sleep.  Today she was accompanied with her mother and was evaluated jointly.  She provided verbal informed consent to have her mother join the appointment.  She insisted that mother stays in the appointment.  She reports that she ran out of her medication about 1-1/2 to 2 weeks ago, and since discontinuing Zoloft she has actually been feeling better.  She describes herself being more "happy", energetic and motivated to do things.  She reports that previously when she was taking Zoloft she was still feeling depressed, irritable, not having any energy to do things etc.  She reports that she has been able to manage her emotions better since she has been off of the medications.  She denies having any problems since the discontinuation of the medicine.  She reports that her mood has been mostly good, had one brief suicidal thoughts in the context of anger secondary to argument with her sister.  She reports that she does not want to act on these thoughts whenever they occur, because she wants to do well  for herself.  She also denies excessive worries or anxiety.  She denies any current SI or HI.  She scored 0 on PHQ-9.  She did score years on questions on [place] thinking about her previous thoughts and not recent.  She reports that she is still eating well, sleeping well.  She reports that she would like to continue to not take the medicine or decrease the dose.  Her mother also reports that since discontinuation of medication she has seen her more happy, more motivated, she has not seen her like this for a very long time.  She reports that she is less angry.  She also asked if patient can decrease the medicine or stop it.  I discussed risks and benefits of continuation versus dis continuation. We mutually agreed to not restart zoloft for now and re-evaluate in a month or she can call early if there is worsening of symptoms. She verbalized understanding.     Visit Diagnosis:    ICD-10-CM   1. Recurrent major depressive disorder, in partial remission (HCC)  F33.41 traZODone (DESYREL) 50 MG tablet    2. Generalized anxiety disorder  F41.1         Past Psychiatric History: As mentioned in initial H&P, reviewed today, as following Patient has never been hospitalized psychiatrically, does not have any history of suicide, was previously seeing Dr. Lucianne Muss and then Dr. Karie Schwalbe at the [facility] outpatient clinic before transitioning to Dr. Daleen Bo and subsequently to this writer.  She is currently not in any psychotherapy.   Past Medical History:  Past Medical History:  Diagnosis Date   Anxiety    Cerebral infarction involving right cerebellar artery (HCC)    intrauterine   CP (cerebral palsy) (HCC)    Epilepsy (HCC)    Headache(784.0)    Left hemiparesis (HCC)    Seizures (HCC)     Past Surgical History:  Procedure Laterality Date   NO PAST SURGERIES      Family Psychiatric History: Reviewed today and as following Paternal Uncle - Depression; Mother - Anxiety, reviewed today and no change.   Family History:  Family History  Problem Relation Age of Onset   Migraines Maternal Grandfather    Alcohol abuse Mother    Anxiety disorder Father    Alcohol abuse Father     Social History:  Social History   Socioeconomic History   Marital status: Single    Spouse name: Not on file   Number of children: Not on file   Years of education: Not on file   Highest education level: Not on file  Occupational History   Not on file  Tobacco Use   Smoking status: Never   Smokeless tobacco: Never  Substance and Sexual Activity   Alcohol use: No   Drug use: No   Sexual activity: Never  Other Topics Concern   Not on file  Social History Narrative   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 She lives with her parents and siblings in separate households. She enjoys swimming, being with her family and eating ice cream.    Social Determinants of Health   Financial Resource Strain: Not on file  Food Insecurity: Not on file  Transportation Needs: Not on file  Physical Activity: Not on file  Stress: Not on file  Social Connections: Not on file    Allergies:  Allergies  Allergen Reactions   Benadryl [Diphenhydramine] Other (See Comments)    seizures    Metabolic Disorder Labs: No results found for: "HGBA1C", "MPG" No results found for: "PROLACTIN" No results found for: "CHOL", "TRIG", "HDL", "CHOLHDL", "VLDL", "LDLCALC" Lab Results  Component Value Date   TSH 1.970 03/11/2018    Therapeutic Level Labs: No results found for: "LITHIUM" No results found for: "VALPROATE" No results found for: "CBMZ"  Current Medications: Current Outpatient Medications  Medication Sig Dispense Refill   norethindrone (MICRONOR) 0.35 MG tablet TAKE 1 TABLET BY MOUTH EVERY DAY 90 tablet 2   traZODone (DESYREL) 50 MG tablet Take 2 tablets (100 mg total) by mouth at bedtime. 180 tablet 1   No current facility-administered medications for this visit.      Musculoskeletal:  Gait & Station:unable to assess since visit was over the telemedicine. Patient leans: unable to assess since visit was over the telemedicine.  Psychiatric Specialty Exam: ROSReview of 12 systems negative except as mentioned in HPI  Blood pressure 101/70, pulse 81, temperature 98.7 F (37.1 C), temperature source Temporal, weight 174 lb 9.6 oz (79.2 kg).Body mass index is 29.05 kg/m.  General Appearance: Casual  Eye Contact:  Fair  Speech:  Clear and Coherent and Normal Rate  Volume:  Normal  Mood:   "good.."  Affect:  Appropriate, Congruent, and Full Range  Thought Process:  Goal Directed and Linear  Orientation:  Full (Time, Place, and Person)  Thought Content: Logical   Suicidal Thoughts:  No  Homicidal Thoughts:  No  Memory:  Immediate;   Fair Recent;   Fair Remote;   Fair  Judgement:  Fair  Insight:  Fair  Psychomotor Activity:  Normal  Concentration:  Concentration: Fair and Attention Span: Fair  Recall:  [employer] of Knowledge: Fair  Language: Fair  Akathisia:  NA  Handed:  Right  AIMS (if indicated): not done  Assets:  Desire for Improvement Financial Resources/Insurance Housing Social Support  ADL's:  Intact  Cognition: WNL  Sleep:  Fair   Screenings: GAD-7    Flowsheet Row Office Visit from 04/15/2022 in [facility]  Total GAD-7 Score 7      PHQ2-9    Flowsheet Row Office Visit from 04/15/2022 in [facility] Office Visit from 06/17/2021 in [facility] Video Visit from 02/17/2021 in [facility] Video Visit from 12/02/2020 in [facility] Office Visit from 02/02/2020 in [facility]  PHQ-2 Total Score 0 1 0 6 4  PHQ-9 Total Score -- 11 -- 16 14      Flowsheet Row Office Visit from 04/15/2022 in [facility]  C-SSRS RISK CATEGORY High Risk         Assessment and Plan:   # Depression - recurrent and in remission - Self discontinued Zoloft as she ran out about 1.5-2 weeks ago and has noted improvement.  - Will monitor and consider starting SSRI if symptoms relapse.    # Generalized Anxiety disorder- Chronic and stable - As mentioned above for depression  # ADHD Chronic and improved - Stopped Dexedrine 2.5 mg BID - Pt does not feel the need to take it.   # Insomnia - Chronic and stable - Continue Trazodone 100mg  QHS  - Return to clinic in 1 month or early if needed.   MDM = 2 or more chronic conditions + med management , MD 04/15/2022, 5:00 PM

## 2022-05-01 ENCOUNTER — Ambulatory Visit: Payer: BC Managed Care – PPO | Admitting: Family Medicine

## 2022-05-08 ENCOUNTER — Encounter: Payer: Self-pay | Admitting: Family Medicine

## 2022-05-08 ENCOUNTER — Ambulatory Visit (INDEPENDENT_AMBULATORY_CARE_PROVIDER_SITE_OTHER): Payer: Commercial Managed Care - HMO | Admitting: Family Medicine

## 2022-05-08 ENCOUNTER — Ambulatory Visit
Admission: RE | Admit: 2022-05-08 | Discharge: 2022-05-08 | Disposition: A | Discharge status: Home / Self Care | Payer: BC Managed Care – PPO | Source: Ambulatory Visit | Attending: Family Medicine | Admitting: Family Medicine

## 2022-05-08 VITALS — BP 94/75 | HR 89 | Ht 65.0 in | Wt 177.4 lb

## 2022-05-08 DIAGNOSIS — M25572 Pain in left ankle and joints of left foot: Secondary | ICD-10-CM

## 2022-05-08 NOTE — Progress Notes (Unsigned)
    SUBJECTIVE:   CHIEF COMPLAINT / HPI:   Initially 1 for question 9 on PHQ-9 but when asked she said that she has not had any thoughts of harming herself for about a month now. She was on zoloft previously where the dose was increased but then it did not help, but when she came off of the zoloft she noticed a significant positive difference. Not taking anything other than trazodone and feels like this is helping her mood. Feels that her mood has been good. Support system is her family and friends. She last had a plan a year ago where she wanted to cut her arm but her family stopped her because she realized that it would really hurt them and did not want her mother to worry. She has not had a plan since then. Denies any prior attempts to kill herself.   Presents today for left ankle pain, reports trauma of her cousin landing on her left ankle while they were jumping on the trampoline. She is able to walk on it now. Reports slight pain that has improved significantly since the incident. Initially her dad noticed swelling, now its better. This occurred 2 years ago but was not able to get evaluated for it until now due to insurance coverage. Rates the pain a 6/10 today and at its worst can be a 10/10.   OBJECTIVE:   BP 94/75   Pulse 89   Ht 5\' 5"  (1.651 m)   Wt 177 lb 6 oz (80.5 kg)   SpO2 98%   BMI 29.52 kg/m   General: Patient well-appearing, in no acute distress. CV: RRR, no murmurs or gallops auscultated Resp: CTAB, no wheezing, rales or rhonchi noted MSK: point tenderness to palpation of left malleolus with slight edema without erythema, normal passive ROM bilaterally Neuro: 5/5 LE strength bilaterally, gross sensation intact  Psych: mood appropriate  ASSESSMENT/PLAN:   Acute left ankle pain -concerning that patient is still in pain 2 years later with point tenderness to lateral malleolus so appropriate to obtain imaging at this time to ensure no fracture or dislocation -RICE therapy  discussed  -follow up as appropriate with PCP    -PHQ-9 score of 9 with negative question 9 reviewed and discussed.  -988 contact provided in case patient experiences SI in the future   Christina Mccall 11-18-1975, DO [facility]

## 2022-05-08 NOTE — Patient Instructions (Signed)
It was great seeing you today!  Today we discussed your mood, I am glad that it is better. If you start to have any thoughts of harming yourself, then please call [phone].   I am concerned that your ankle pain has continued to bother you and have a little swelling. We will get imaging. You can go to [facility] to get an x-ray of your ankle. Please go anytime before 4pm, you do not need to schedule an appointment.   Please follow up at your next scheduled appointment, if anything arises between now and then, please don't hesitate to contact our office.   Thank you for allowing [place] to be a part of your medical care!  Thank you, Dr. Robyne Peers

## 2022-05-09 DIAGNOSIS — M25572 Pain in left ankle and joints of left foot: Secondary | ICD-10-CM | POA: Insufficient documentation

## 2022-05-09 NOTE — Progress Notes (Incomplete)
    SUBJECTIVE:   CHIEF COMPLAINT / HPI:   Initially 1 for question 9 on PHQ-9 but when asked she said that she has not had any thoughts of harming herself for about a month now. She was on zoloft previously where the dose was increased but then it did not help, but when she came off of the zoloft she noticed a significant positive difference. Not taking anything other than trazodone and feels like this is helping her mood. Feels that her mood has been good. Support system is her family and friends. She last had a plan a year ago where she wanted to cut her arm but her family stopped her because she realized that it would really hurt them and did not want her mother to worry. She has not had a plan since then. Denies any prior attempts to kill herself.   Presents today for left ankle pain, reports trauma of her cousin landing on her left ankle while they were jumping on the trampoline. She is able to walk on it now. Reports slight pain that has improved significantly since the incident. Initially her dad noticed swelling, now its better. This occurred 2 years ago but was not able to get evaluated for it until now due to insurance coverage. Rates the pain a 6/10 today and at its worst can be a 10/10.   OBJECTIVE:   BP 94/75   Pulse 89   Ht 5\' 5"  (1.651 m)   Wt 177 lb 6 oz (80.5 kg)   SpO2 98%   BMI 29.52 kg/m   General: Patient well-appearing, in no acute distress. CV: RRR, no murmurs or gallops auscultated Resp: CTAB, no wheezing, rales or rhonchi noted MSK: *** Neuro: 5/5 LE strength bilaterally, gross sensation intact  MSK***left lateral malleolus point tenderness and slight edema without erythema  Psych:*** ASSESSMENT/PLAN:   No problem-specific Assessment & Plan notes found for this encounter.  Imaging, RICE 988 given in case patient experiences SI   -PHQ-9 score of 9 with negative question 9 reviewed and discussed.   , DO [facility]

## 2022-05-09 NOTE — Assessment & Plan Note (Signed)
-  concerning that patient is still in pain 2 years later with point tenderness to lateral malleolus so appropriate to obtain imaging at this time to ensure no fracture or dislocation -RICE therapy discussed  -follow up as appropriate with PCP

## 2022-05-11 ENCOUNTER — Encounter: Payer: Self-pay | Admitting: Family Medicine

## 2022-05-12 ENCOUNTER — Telehealth (INDEPENDENT_AMBULATORY_CARE_PROVIDER_SITE_OTHER): Payer: Commercial Managed Care - HMO | Admitting: Child and Adolescent Psychiatry

## 2022-05-12 DIAGNOSIS — F411 Generalized anxiety disorder: Secondary | ICD-10-CM | POA: Diagnosis not present

## 2022-05-12 DIAGNOSIS — F3341 Major depressive disorder, recurrent, in partial remission: Secondary | ICD-10-CM

## 2022-05-12 NOTE — Progress Notes (Signed)
Virtual Visit via Video Note  I connected with Christina Mccall on 05/12/22 at  1:30 PM EDT by a video enabled telemedicine application and verified that I am speaking with the correct person using two identifiers.  Location: Patient: home Provider: office   I discussed the limitations of evaluation and management by telemedicine and the availability of in person appointments. The patient expressed understanding and agreed to proceed.   I discussed the assessment and treatment plan with the patient. The patient was provided an opportunity to ask questions and all were answered. The patient agreed with the plan and demonstrated an understanding of the instructions.   The patient was advised to call back or seek an in-person evaluation if the symptoms worsen or if the condition fails to improve as anticipated.  I provided 15 minutes of non-face-to-face time during this encounter.   Darcel Smalling, MD   [facility] MD/PA/NP OP Progress Note  05/12/2022 1:52 PM Christina Mccall  MRN:  [id number]  Chief Complaint:  Follow-up for mood and anxiety.   Synopsis: This is a 21 year old Caucasian female with medical history significant of left hemiparesis since 2002, generalized epilepsy and psychiatric history significant of mood disorder, generalized anxiety disorder and ADHD has been under outpatient psychiatric care at [facility] since 2015, and med trials include Effexor XR, Lamictal, Abilify, Topamax in the past, currently on Zoloft and Trazodone.   HPI:   [name] was seen and evaluated over telemedicine encounter for medication management follow-up.  She was accompanied by herself at her sister's home and was evaluated alone.  She reports that she has continued to do well since the last appointment.  She reports that her mood has remained same, denies any low lows, reports occasional sadness that occurs about once a week but denies any prolonged depressive mood.  She does report that she gets bored as she  spends most of the time at home but usually keeps herself busy with cleaning, doing chores, watching TV and working on Therapist, nutritional.  She is also applying to jobs.  She reports that she is sleeping well with trazodone, would like to continue with trazodone at the current dose.  She reports that she has restful sleep, has decent energy, denies problems with appetite or concentration, denies any feelings of worthlessness and denies any SI or HI.  She denies any nonsuicidal self-harm behaviors or thoughts.  She denies excessive worries or anxiety except anxiety related to social security applications.  She reports that she does not have control over this situation and therefore she can only hope for the best and keep trying.  We discussed medications and she would like to continue to be off of her medications and only continue with the trazodone.  She also does not feel the need to be in therapy at present.  She scored 6 on PHQ-9, about 4 days ago at her primary care's office she scored 9 on PHQ-9.  She reports that she will let this writer know if her depression or anxiety worsens. She will follow back in 6 weeks or earlier if needed.    Visit Diagnosis:    ICD-10-CM   1. Generalized anxiety disorder  F41.1     2. Recurrent major depressive disorder, in partial remission (HCC)  F33.41         Past Psychiatric History: As mentioned in initial H&P, reviewed today, as following Patient has never been hospitalized psychiatrically, does not have any history of suicide, was previously seeing Dr. Lucianne Muss  and then Dr. Karie Schwalbe at the [facility] outpatient clinic before transitioning to Dr. Daleen Bo and subsequently to this writer.  She is currently not in any psychotherapy.   Past Medical History:  Past Medical History:  Diagnosis Date   Anxiety    Cerebral infarction involving right cerebellar artery (HCC)    intrauterine   CP (cerebral palsy) (HCC)    Epilepsy (HCC)    Headache(784.0)    Left  hemiparesis (HCC)    Seizures (HCC)     Past Surgical History:  Procedure Laterality Date   NO PAST SURGERIES      Family Psychiatric History: Reviewed today and as following Paternal Uncle - Depression; Mother - Anxiety, reviewed today and no change.  Family History:  Family History  Problem Relation Age of Onset   Migraines Maternal Grandfather    Alcohol abuse Mother    Anxiety disorder Father    Alcohol abuse Father     Social History:  Social History   Socioeconomic History   Marital status: Single    Spouse name: Not on file   Number of children: Not on file   Years of education: Not on file   Highest education level: Not on file  Occupational History   Not on file  Tobacco Use   Smoking status: Never   Smokeless tobacco: Never  Substance and Sexual Activity   Alcohol use: No   Drug use: No   Sexual activity: Never  Other Topics Concern   Not on file  Social History Narrative   [name] is a 9th grade student at [employer]; she does well in school. She lives with her parents and siblings in separate households. She enjoys swimming, being with her family and eating ice cream.    Social Determinants of Health   Financial Resource Strain: Not on file  Food Insecurity: Not on file  Transportation Needs: Not on file  Physical Activity: Not on file  Stress: Not on file  Social Connections: Not on file    Allergies:  Allergies  Allergen Reactions   Benadryl [Diphenhydramine] Other (See Comments)    seizures    Metabolic Disorder Labs: No results found for: "HGBA1C", "MPG" No results found for: "PROLACTIN" No results found for: "CHOL", "TRIG", "HDL", "CHOLHDL", "VLDL", "LDLCALC" Lab Results  Component Value Date   TSH 1.970 03/11/2018    Therapeutic Level Labs: No results found for: "LITHIUM" No results found for: "VALPROATE" No results found for: "CBMZ"  Current Medications: Current Outpatient Medications  Medication Sig Dispense  Refill   norethindrone (MICRONOR) 0.35 MG tablet TAKE 1 TABLET BY MOUTH EVERY DAY 90 tablet 2   traZODone (DESYREL) 50 MG tablet Take 2 tablets (100 mg total) by mouth at bedtime. 180 tablet 1   No current facility-administered medications for this visit.     Musculoskeletal:  Gait & Station:unable to assess since visit was over the telemedicine. Patient leans: unable to assess since visit was over the telemedicine.  Psychiatric Specialty Exam: ROSReview of 12 systems negative except as mentioned in HPI  There were no vitals taken for this visit.There is no height or weight on file to calculate BMI.  General Appearance: Casual  Eye Contact:  Fair  Speech:  Clear and Coherent and Normal Rate  Volume:  Normal  Mood:   "good.."  Affect:  Appropriate, Congruent, and Full Range  Thought Process:  Goal Directed and Linear  Orientation:  Full (Time, Place, and Person)  Thought Content: Logical  Suicidal Thoughts:  No  Homicidal Thoughts:  No  Memory:  Immediate;   Fair Recent;   Fair Remote;   Fair  Judgement:  Fair  Insight:  Fair  Psychomotor Activity:  Normal  Concentration:  Concentration: Fair and Attention Span: Fair  Recall:  [employer] of Knowledge: Fair  Language: Fair  Akathisia:  NA  Handed:  Right  AIMS (if indicated): not done  Assets:  Desire for Improvement Financial Resources/Insurance Housing Social Support  ADL's:  Intact  Cognition: WNL  Sleep:  Fair   Screenings: GAD-7    Flowsheet Row Office Visit from 04/15/2022 in [facility]  Total GAD-7 Score 7      PHQ2-9    Flowsheet Row Video Visit from 05/12/2022 in [facility] Office Visit from 05/08/2022 in [facility] Office Visit from 04/15/2022 in [facility] Office Visit from 06/17/2021 in [facility] Video Visit from 02/17/2021 in [facility]   PHQ-2 Total Score 2 4 0 1 0  PHQ-9 Total Score 6 9 -- 11 --      Flowsheet Row Video Visit from 05/12/2022 in [facility] Office Visit from 04/15/2022 in [facility]  C-SSRS RISK CATEGORY Error: Q3, 4, or 5 should not be populated when Q2 is No High Risk        Assessment and Plan:   # Depression - recurrent and in remission - Self discontinued Zoloft and would like to be off of medications as she has noted improvement after discontinuing treatment.  - Will monitor and consider starting SSRI if symptoms relapse.    # Generalized Anxiety disorder- Chronic and stable - As mentioned above for depression  # ADHD Chronic and improved - Stopped Dexedrine 2.5 mg BID - Pt does not feel the need to take it.   # Insomnia - Chronic and stable - Continue Trazodone 100mg  QHS  - Return to clinic in 6 weeks or early if needed.   MDM = 2 or more chronic conditions + med management , MD 05/12/2022, 1:52 PM

## 2022-05-19 ENCOUNTER — Telehealth: Payer: BC Managed Care – PPO | Admitting: Child and Adolescent Psychiatry

## 2022-05-21 ENCOUNTER — Telehealth: Payer: Self-pay | Admitting: Child and Adolescent Psychiatry

## 2022-05-21 ENCOUNTER — Other Ambulatory Visit (HOSPITAL_COMMUNITY): Payer: Self-pay | Admitting: Psychiatry

## 2022-05-21 MED ORDER — ESCITALOPRAM OXALATE 10 MG PO TABS: 10.0000 mg | ORAL_TABLET | Freq: Every day | ORAL | 2 refills | Status: DC

## 2022-05-21 NOTE — Telephone Encounter (Signed)
spoke with patient she states she not going to [employer] and she not going to the ER.   I asked patient is she was by herself and she stated that someone was there with her. Pt was strongly advised to go to the ER and again she denied

## 2022-05-21 NOTE — Telephone Encounter (Signed)
Medication management - Attempted to reach pt first at her number but no answer and no voicemail set up. Called [phone], and pt's Mother, Leonard Schwartz, stated pt was with her Grandmother and in no danger.  Collateral provided # [phone]. Telephone call then with patient who admitted she had done some superficial cutting when mad but denied she was having any current thoughts of wanting to harm herself or others with no plans or intent to want to harm self.  Patient stated she had tried to stop medications but felt she needed to go back on something as was previously on Zoloft to help with mood and when she gets mad.  Patient stated she was staying with her Grandmother tonight and denied she was in no danger but did want to speak to provider to restart medication. Patient refused to go to the [facility] or ED stating she was not wanting to harm self and was not harming herself and just wanted to go back on some medications to help with her mood.  Informed Dr. Jerold Coombe was out this weed but would send request to restart something and to speak to covering provider if they would call her back today at [phone] phone number.  Agreed to send request to covering provider as patient's mother also stated she did not feel patient was a danger to self or anyone at this time today.

## 2022-05-21 NOTE — Telephone Encounter (Signed)
Spoke to pt, able to contract for safety. Lexapro 10 mg sent in

## 2022-05-21 NOTE — Telephone Encounter (Signed)
I would recommend [facility], not the ED, they are more wiling to make med adjustments

## 2022-05-21 NOTE — Telephone Encounter (Signed)
Christina Mccall has appot for 07-03-22 but wanted sooner 05-28-22 due to needing to speak to her doctor. She wants to go on medication. Was on zoloft and did good for awhile but in last few days has felt  anger, depression, wanting to hurt herself. And has cut herself. Advised her to go to ED, or [facility] if in crisis. Verbalized understanding. Please advise.

## 2022-05-28 ENCOUNTER — Telehealth (HOSPITAL_COMMUNITY): Payer: Self-pay | Admitting: Professional

## 2022-05-28 ENCOUNTER — Telehealth (INDEPENDENT_AMBULATORY_CARE_PROVIDER_SITE_OTHER): Payer: Commercial Managed Care - HMO | Admitting: Child and Adolescent Psychiatry

## 2022-05-28 DIAGNOSIS — G4700 Insomnia, unspecified: Secondary | ICD-10-CM | POA: Diagnosis not present

## 2022-05-28 DIAGNOSIS — F411 Generalized anxiety disorder: Secondary | ICD-10-CM | POA: Diagnosis not present

## 2022-05-28 DIAGNOSIS — F331 Major depressive disorder, recurrent, moderate: Secondary | ICD-10-CM

## 2022-05-28 DIAGNOSIS — F909 Attention-deficit hyperactivity disorder, unspecified type: Secondary | ICD-10-CM

## 2022-05-28 NOTE — Progress Notes (Signed)
Virtual Visit via Video Note  I connected with Christina Mccall on 05/28/22 at 11:30 AM EDT by a video enabled telemedicine application and verified that I am speaking with the correct person using two identifiers.  Location: Patient: home Provider: office   I discussed the limitations of evaluation and management by telemedicine and the availability of in person appointments. The patient expressed understanding and agreed to proceed.   I discussed the assessment and treatment plan with the patient. The patient was provided an opportunity to ask questions and all were answered. The patient agreed with the plan and demonstrated an understanding of the instructions.   The patient was advised to call back or seek an in-person evaluation if the symptoms worsen or if the condition fails to improve as anticipated.  I provided 25 minutes of non-face-to-face time during this encounter.   Darcel Smalling, MD   [facility] MD/PA/NP OP Progress Note  05/28/2022 12:07 PM Christina Mccall  MRN:  [id number]  Chief Complaint:  Follow-up for mood and anxiety.   Synopsis: This is a 21 year old Caucasian female with medical history significant of left hemiparesis since 2002, generalized epilepsy and psychiatric history significant of mood disorder, generalized anxiety disorder and ADHD has been under outpatient psychiatric care at [facility] since 2015, and med trials include Effexor XR, Lamictal, Abilify, Topamax in the past, currently on Zoloft and Trazodone.   HPI:   Christina Mccall was seen and evaluated over telemedicine encounter for medication management follow-up. Appointment was attended by clinical observer Lovell Sheehan with pt and parent's verbal informed consent to allow her to attend the appointment.  In the interim since last appointment she called about a week ago and reported that she wanted to go back on the medication because she was having suicidal thoughts and had cut herself superficially.  Covering provider  spoke with the patient and started her on Lexapro 10 mg on August 24, and was recommended follow-up with this Clinical research associate.  Therefore patient today presents for follow-up.  She reports that she has tolerated Lexapro well except on the second that she was having headache and body aches which has improved since then.  When asked about the incident that led to suicidal thoughts and self-harm behaviors, she reports that she and her sister had an argument, she became very upset, had superficially cut herself but her father called and was able to calm her down which helped her not to cut more deep.  She reports that since then she intermittently has passive suicidal thoughts such as "I wish I was not here".  This happens in the context of over thinking that she is not able to do things independently, has to rely on others to go places, she is not good enough etc. and that makes her feel depressed.  She reports that she has been sleeping about 8 to 9 hours at night, has noticed some lack of appetite since she initiated Lexapro but did not have any problems with appetite before that, has decent energy.  She also denies any anxiety.  She denies any current suicidal thoughts and last time she had suicidal thoughts was about 2 days ago, described these thoughts as passive in nature, reports that she plays with her nephew and that helps her and she will contact her parents if she needs to if she has these thoughts again.  We discussed options for PHP/IOP, she agrees for referral, will make a referral for virtual IOP or PHP option.  She is also recommended to  follow-up with vocational rehabilitation office in [facility] which will be helpful for her in regards of independence and that may alleviate some of her stressors and provide more confidence to her.  We discussed to continue with Lexapro 10 mg since she just initiated 7 days ago.  She will follow back again in 4 weeks or earlier if needed.   Visit Diagnosis:     ICD-10-CM   1. Moderate episode of recurrent major depressive disorder (HCC)  F33.1     2. Generalized anxiety disorder  F41.1         Past Psychiatric History: As mentioned in initial H&P, reviewed today, as following Patient has never been hospitalized psychiatrically, does not have any history of suicide, was previously seeing Dr. Dwyane Dee and then Dr. Darene Lamer at the [facility] outpatient clinic before transitioning to Dr. [name] and subsequently to this writer.  She is currently not in any psychotherapy.   Past Medical History:  Past Medical History:  Diagnosis Date   Anxiety    Cerebral infarction involving right cerebellar artery ([place])    intrauterine   CP (cerebral palsy) ([place])    Epilepsy ([place])    Headache(784.0)    Left hemiparesis ([place])    Seizures ([place])     Past Surgical History:  Procedure Laterality Date   NO PAST SURGERIES      Family Psychiatric History: Reviewed today and as following Paternal Uncle - Depression; Mother - Anxiety, reviewed today and no change.  Family History:  Family History  Problem Relation Age of Onset   Migraines Maternal Grandfather    Alcohol abuse Mother    Anxiety disorder Father    Alcohol abuse Father     Social History:  Social History   Socioeconomic History   Marital status: Single    Spouse name: Not on file   Number of children: Not on file   Years of education: Not on file   Highest education level: Not on file  Occupational History   Not on file  Tobacco Use   Smoking status: Never   Smokeless tobacco: Never  Substance and Sexual Activity   Alcohol use: No   Drug use: No   Sexual activity: Never  Other Topics Concern   Not on file  Social History Narrative   Lachandra is a 9th grade student at [employer]; she does well in school. She lives with her parents and siblings in separate households. She enjoys swimming, being with her family and eating ice cream.    Social Determinants of Health   Financial  Resource Strain: Not on file  Food Insecurity: Not on file  Transportation Needs: Not on file  Physical Activity: Not on file  Stress: Not on file  Social Connections: Not on file    Allergies:  Allergies  Allergen Reactions   Benadryl [Diphenhydramine] Other (See Comments)    seizures    Metabolic Disorder Labs: No results found for: "HGBA1C", "MPG" No results found for: "PROLACTIN" No results found for: "CHOL", "TRIG", "HDL", "CHOLHDL", "VLDL", "LDLCALC" Lab Results  Component Value Date   TSH 1.970 03/11/2018    Therapeutic Level Labs: No results found for: "LITHIUM" No results found for: "VALPROATE" No results found for: "CBMZ"  Current Medications: Current Outpatient Medications  Medication Sig Dispense Refill   escitalopram (LEXAPRO) 10 MG tablet Take 1 tablet (10 mg total) by mouth daily. 30 tablet 2   norethindrone (MICRONOR) 0.35 MG tablet TAKE 1 TABLET BY MOUTH EVERY DAY 90  tablet 2   traZODone (DESYREL) 50 MG tablet Take 2 tablets (100 mg total) by mouth at bedtime. 180 tablet 1   No current facility-administered medications for this visit.     Musculoskeletal:  Gait & Station:unable to assess since visit was over the telemedicine. Patient leans: unable to assess since visit was over the telemedicine.  Psychiatric Specialty Exam: ROSReview of 12 systems negative except as mentioned in HPI  There were no vitals taken for this visit.There is no height or weight on file to calculate BMI.  General Appearance: Casual  Eye Contact:  Fair  Speech:  Clear and Coherent and Normal Rate  Volume:  Normal  Mood:   "good.."  Affect:  Appropriate, Congruent, and Full Range  Thought Process:  Goal Directed and Linear  Orientation:  Full (Time, Place, and Person)  Thought Content: Logical   Suicidal Thoughts:  No  Homicidal Thoughts:  No  Memory:  Immediate;   Fair Recent;   Fair Remote;   Fair  Judgement:  Fair  Insight:  Fair  Psychomotor Activity:  Normal   Concentration:  Concentration: Fair and Attention Span: Fair  Recall:  [employer] of Knowledge: Fair  Language: Fair  Akathisia:  NA  Handed:  Right  AIMS (if indicated): not done  Assets:  Desire for Improvement Financial Resources/Insurance Housing Social Support  ADL's:  Intact  Cognition: WNL  Sleep:  Fair   Screenings: GAD-7    Flowsheet Row Office Visit from 04/15/2022 in [facility]  Total GAD-7 Score 7      PHQ2-9    Flowsheet Row Video Visit from 05/12/2022 in [facility] Office Visit from 05/08/2022 in [facility] Office Visit from 04/15/2022 in [facility] Office Visit from 06/17/2021 in [facility] Video Visit from 02/17/2021 in [facility]  PHQ-2 Total Score 2 4 0 1 0  PHQ-9 Total Score 6 9 -- 11 --      Flowsheet Row Video Visit from 05/12/2022 in [facility] Office Visit from 04/15/2022 in [facility]  C-SSRS RISK CATEGORY Error: Q3, 4, or 5 should not be populated when Q2 is No High Risk        Assessment and Plan:   # Depression - recurrent and in remission - Self discontinued Zoloft and wanted to be off of medications as she has noted improvement after discontinuing treatment, however started to notice intermittent worsening of mood, passive SI and had cut her self superficially following an argument with sister. Due to her disability, she struggles with independence and that seems to lead to overthink about her life and depression.  - She is tolerating lexapro 10 mg well which was started about a week ago. Recommended to continue.  -  Recommended PHP/IOP, she agrees for referral, will send a referral to PHP or IOP whichever is available virtually.   # Generalized Anxiety disorder- Chronic and stable - As mentioned above for depression  # ADHD  Chronic and improved - Stopped Dexedrine 2.5 mg BID - Pt does not feel the need to take it.   # Insomnia - Chronic and stable - Continue Trazodone 100mg  QHS  - Return to clinic in 4 weeks or early if needed.   MDM = 2 or more chronic conditions + med management , MD 05/28/2022, 12:07 PM

## 2022-06-10 ENCOUNTER — Telehealth (HOSPITAL_COMMUNITY): Payer: Self-pay | Admitting: Professional

## 2022-06-10 ENCOUNTER — Other Ambulatory Visit (HOSPITAL_COMMUNITY): Payer: Commercial Managed Care - HMO | Attending: Psychiatry

## 2022-06-12 ENCOUNTER — Other Ambulatory Visit: Payer: Self-pay

## 2022-06-12 ENCOUNTER — Encounter: Payer: Self-pay | Admitting: Student

## 2022-06-12 ENCOUNTER — Ambulatory Visit (INDEPENDENT_AMBULATORY_CARE_PROVIDER_SITE_OTHER): Payer: Commercial Managed Care - HMO | Admitting: Student

## 2022-06-12 ENCOUNTER — Other Ambulatory Visit (HOSPITAL_COMMUNITY)
Admission: RE | Admit: 2022-06-12 | Discharge: 2022-06-12 | Disposition: A | Discharge status: Home / Self Care | Payer: Commercial Managed Care - HMO | Source: Ambulatory Visit | Attending: Family Medicine | Admitting: Family Medicine

## 2022-06-12 VITALS — BP 111/73 | HR 81 | Wt 176.6 lb

## 2022-06-12 DIAGNOSIS — Z30017 Encounter for initial prescription of implantable subdermal contraceptive: Secondary | ICD-10-CM

## 2022-06-12 DIAGNOSIS — Z124 Encounter for screening for malignant neoplasm of cervix: Secondary | ICD-10-CM

## 2022-06-12 DIAGNOSIS — Z113 Encounter for screening for infections with a predominantly sexual mode of transmission: Secondary | ICD-10-CM | POA: Diagnosis not present

## 2022-06-12 DIAGNOSIS — Z13228 Encounter for screening for other metabolic disorders: Secondary | ICD-10-CM

## 2022-06-12 DIAGNOSIS — Z3046 Encounter for surveillance of implantable subdermal contraceptive: Secondary | ICD-10-CM

## 2022-06-12 NOTE — Patient Instructions (Signed)
It was great to see you! Thank you for allowing me to participate in your care!   Our plans for today:  - I will message you about the results -please return to discuss nexplanon  - Today at your annual preventive visit we talked about the following measures:   I recommend 150 minutes of exercise per week-try 30 minutes 5 days per week We discussed reducing sugary beverages (like soda and juice) and increasing leafy greens and whole fruits.  We discussed avoiding tobacco and alcohol.  I recommend avoiding illicit substances.  Your blood pressure is at goal  Take care and seek immediate care sooner if you develop any concerns.  Levin Erp, MD

## 2022-06-12 NOTE — Progress Notes (Unsigned)
    SUBJECTIVE:   Chief compliant/HPI: annual examination  Christina Mccall is a 21 y.o. who presents today for an annual exam.   Says she would like nexplanon today and to be off of pills. She has not had a sex in 1 year and has periods monthly.   Review of systems form notable for patient smoke marijuana and drinks socially but does not intake any nicotine products   Updated history tabs and problem list.   OBJECTIVE:   BP 111/73   Pulse 81   Wt 176 lb 9.6 oz (80.1 kg)   SpO2 100%   BMI 29.39 kg/m   General: Well appearing, NAD, awake, alert, responsive to questions Head: Normocephalic atraumatic CV: Regular rate and rhythm no murmurs rubs or gallops Respiratory: Clear to ausculation bilaterally, no wheezes rales or crackles, chest rises symmetrically,  no increased work of breathing Abdomen: Soft, non-tender, non-distended, normoactive bowel sounds  Extremities: Moves upper and lower extremities freely, no edema in LE Neuro: No focal deficits Skin: No rashes or lesions visualized  Pelvic: VULVA: normal appearing vulva with no masses, tenderness or lesions, VAGINA: Normal appearing vagina with normal color, no lesions, with scant discharge present, CERVIX: No lesions, scant discharge present  Chaperone Deseree Blount CMA present for pelvic exam   GYNECOLOGY PROCEDURE NOTE  Christina Mccall is a 21 y.o. No obstetric history on file. here for Nexplanon insertion. No other gynecologic concerns.  Nexplanon Insertion Patient identified, informed consent performed, consent signed.   Patient does understand that irregular bleeding is a very common side effect of this medication. She was advised to have backup contraception for one week after placement.  Appropriate time out taken.  Patient's left arm was prepped and draped in the usual sterile fashion. The insertion area was measured and marked.  Patient was prepped with alcohol swab and then injected with 3 ml of 1% lidocaine with  epinephrine.  The area was then prepped with betadine, Nexplanon removed from packaging,  device confirmed present within needle, then inserted full length of needle and withdrawn per handbook instructions. Nexplanon was able to palpated in the patient's arm; patient palpated the insert herself. There was minimal blood loss.  Patient insertion site covered with guaze and a pressure bandage to reduce any bruising.  The patient tolerated the procedure well and was given post procedure instructions.    ASSESSMENT/PLAN:   Nexplanon insertion Implanted today. D/c pills, discussed back up contaception   Annual Examination  See AVS for age appropriate recommendations.   PHQ score 5, reviewed and discussed. Blood pressure reviewed and at goal.  Asked about intimate partner violence and patient reports none.  The patient currently uses pills for contraception. Folate recommended as appropriate, minimum of 400 mcg per day. Nexplanon placed today   Considered the following items based upon USPSTF recommendations: HIV testing: ordered Hepatitis C: ordered Hepatitis B: discussed Syphilis if at high risk: ordered GC/CT 24 or  younger, ordered. Lipid panel (nonfasting or fasting) discussed based upon AHA recommendations and not ordered.  Consider repeat every 4-6 years.  Reviewed risk factors for latent tuberculosis and not indicated  Cervical cancer screening: due for Pap today, cytology alone ordered (HPV if ASCUS) Immunizations none today   Follow up in 1  year or sooner if indicated.    Levin Erp, MD [facility]

## 2022-06-13 LAB — HCV AB W REFLEX TO QUANT PCR: HCV Ab: NONREACTIVE

## 2022-06-13 LAB — HIV ANTIBODY (ROUTINE TESTING W REFLEX): HIV Screen 4th Generation wRfx: NONREACTIVE

## 2022-06-13 LAB — RPR: RPR Ser Ql: NONREACTIVE

## 2022-06-13 LAB — HCV INTERPRETATION

## 2022-06-14 NOTE — Assessment & Plan Note (Signed)
Implanted today. D/c pills, discussed back up contaception

## 2022-06-17 LAB — CYTOLOGY - PAP
Chlamydia: NEGATIVE
Comment: NEGATIVE
Comment: NORMAL
Diagnosis: NEGATIVE
Neisseria Gonorrhea: NEGATIVE

## 2022-06-24 MED ORDER — ETONOGESTREL 68 MG ~~LOC~~ IMPL
68.0000 mg | DRUG_IMPLANT | Freq: Once | SUBCUTANEOUS | Status: AC
  Administered 2022-06-12: 68 mg via SUBCUTANEOUS

## 2022-06-24 NOTE — Addendum Note (Signed)
Addended by: Talbot Grumbling on: 06/24/2022 12:26 PM   Modules accepted: Orders

## 2022-06-26 ENCOUNTER — Telehealth: Payer: Commercial Managed Care - HMO | Admitting: Child and Adolescent Psychiatry

## 2022-07-03 ENCOUNTER — Telehealth: Payer: Commercial Managed Care - HMO | Admitting: Child and Adolescent Psychiatry

## 2022-07-07 ENCOUNTER — Telehealth (INDEPENDENT_AMBULATORY_CARE_PROVIDER_SITE_OTHER): Payer: Commercial Managed Care - HMO | Admitting: Child and Adolescent Psychiatry

## 2022-07-07 DIAGNOSIS — F411 Generalized anxiety disorder: Secondary | ICD-10-CM

## 2022-07-07 DIAGNOSIS — F3341 Major depressive disorder, recurrent, in partial remission: Secondary | ICD-10-CM | POA: Diagnosis not present

## 2022-07-07 NOTE — Progress Notes (Signed)
Virtual Visit via Video Note  I connected with Christina Mccall on 07/07/22 at  1:00 PM EDT by a video enabled telemedicine application and verified that I am speaking with the correct person using two identifiers.  Location: Patient: home Provider: office   I discussed the limitations of evaluation and management by telemedicine and the availability of in person appointments. The patient expressed understanding and agreed to proceed.   I discussed the assessment and treatment plan with the patient. The patient was provided an opportunity to ask questions and all were answered. The patient agreed with the plan and demonstrated an understanding of the instructions.   The patient was advised to call back or seek an in-person evaluation if the symptoms worsen or if the condition fails to improve as anticipated.  I provided 15 minutes of non-face-to-face time during this encounter.   Darcel Smalling, MD   [facility] MD/PA/NP OP Progress Note  07/07/2022 1:25 PM Harrietta Incorvaia  MRN:  [id number]  Chief Complaint:  Medication management follow-up for mood and anxiety.   Synopsis: This is a 21 year old Caucasian female with medical history significant of left hemiparesis since 2002, generalized epilepsy and psychiatric history significant of mood disorder, generalized anxiety disorder and ADHD has been under outpatient psychiatric care at [facility] since 2015, and med trials include Effexor XR, Lamictal, Abilify, Topamax in the past, currently on Zoloft and Trazodone.   HPI:   Christina Mccall was seen and evaluated over telemedicine encounter for medication management follow-up.  At her last appointment she was recommended to continue with Lexapro 10 mg once a day and was recommended PHP/IOP.  [name] reports that she was contacted by Child psychotherapist from [facility] program but she did not want to start it because she started working at 2 jobs and got very busy and does not feel the need at this time.  She reports that  she moved with her dad last month, started working at [employer], and with all this changes she has been feeling better and does not feel stuck at home.  She reports that her mood has been "good", denies any low lows or depressed mood.  She also denies excessive worries or anxiety.  She reports that she has been spending time with her family, spends time with her mother on Wednesdays and enjoys these activities.  She reports that she has been sleeping well, sleep is restful, eating well, denies any SI or HI.  She reports that she has been compliant with her medications and denies any problems with them.  We discussed to continue with Lexapro 10 mg once a day as well as trazodone at night.   Visit Diagnosis:    ICD-10-CM   1. Generalized anxiety disorder  F41.1     2. Recurrent major depressive disorder, in partial remission (HCC)  F33.41         Past Psychiatric History: As mentioned in initial H&P, reviewed today, as following Patient has never been hospitalized psychiatrically, does not have any history of suicide, was previously seeing Dr. Lucianne Muss and then Dr. Karie Schwalbe at the [facility] outpatient clinic before transitioning to Dr. Daleen Bo and subsequently to this writer.  She is currently not in any psychotherapy.   Past Medical History:  Past Medical History:  Diagnosis Date   Anxiety    Cerebral infarction involving right cerebellar artery (HCC)    intrauterine   CP (cerebral palsy) (HCC)    Epilepsy (HCC)    Headache(784.0)    Left hemiparesis (HCC)  Seizures (HCC)     Past Surgical History:  Procedure Laterality Date   NO PAST SURGERIES      Family Psychiatric History: Reviewed today and as following Paternal Uncle - Depression; Mother - Anxiety, reviewed today and no change.  Family History:  Family History  Problem Relation Age of Onset   Migraines Maternal Grandfather    Alcohol abuse Mother    Anxiety disorder Father    Alcohol abuse Father     Social History:   Social History   Socioeconomic History   Marital status: Single    Spouse name: Not on file   Number of children: Not on file   Years of education: Not on file   Highest education level: Not on file  Occupational History   Not on file  Tobacco Use   Smoking status: Never   Smokeless tobacco: Never  Substance and Sexual Activity   Alcohol use: No   Drug use: No   Sexual activity: Never  Other Topics Concern   Not on file  Social History Narrative   Macenzie is a 9th grade student at [employer]; she does well in school. She lives with her parents and siblings in separate households. She enjoys swimming, being with her family and eating ice cream.    Social Determinants of Health   Financial Resource Strain: Not on file  Food Insecurity: Not on file  Transportation Needs: Not on file  Physical Activity: Not on file  Stress: Not on file  Social Connections: Not on file    Allergies:  Allergies  Allergen Reactions   Benadryl [Diphenhydramine] Other (See Comments)    seizures    Metabolic Disorder Labs: No results found for: "HGBA1C", "MPG" No results found for: "PROLACTIN" No results found for: "CHOL", "TRIG", "HDL", "CHOLHDL", "VLDL", "LDLCALC" Lab Results  Component Value Date   TSH 1.970 03/11/2018    Therapeutic Level Labs: No results found for: "LITHIUM" No results found for: "VALPROATE" No results found for: "CBMZ"  Current Medications: Current Outpatient Medications  Medication Sig Dispense Refill   escitalopram (LEXAPRO) 10 MG tablet Take 1 tablet (10 mg total) by mouth daily. 30 tablet 2   norethindrone (MICRONOR) 0.35 MG tablet TAKE 1 TABLET BY MOUTH EVERY DAY 90 tablet 2   traZODone (DESYREL) 50 MG tablet Take 2 tablets (100 mg total) by mouth at bedtime. 180 tablet 1   No current facility-administered medications for this visit.     Musculoskeletal:  Gait & Station:unable to assess since visit was over the telemedicine. Patient  leans: unable to assess since visit was over the telemedicine.  Psychiatric Specialty Exam: ROSReview of 12 systems negative except as mentioned in HPI  There were no vitals taken for this visit.There is no height or weight on file to calculate BMI.  General Appearance: Casual  Eye Contact:  Fair  Speech:  Clear and Coherent and Normal Rate  Volume:  Normal  Mood:   "good.."  Affect:  Appropriate, Congruent, and Full Range  Thought Process:  Goal Directed and Linear  Orientation:  Full (Time, Place, and Person)  Thought Content: Logical   Suicidal Thoughts:  No  Homicidal Thoughts:  No  Memory:  Immediate;   Fair Recent;   Fair Remote;   Fair  Judgement:  Fair  Insight:  Fair  Psychomotor Activity:  Normal  Concentration:  Concentration: Fair and Attention Span: Fair  Recall:  [employer] of Knowledge: Fair  Language: Fair  Akathisia:  NA  Handed:  Right  AIMS (if indicated): not done  Assets:  Desire for Improvement Financial Resources/Insurance Housing Social Support  ADL's:  Intact  Cognition: WNL  Sleep:  Fair   Screenings: GAD-7    Flowsheet Row Office Visit from 04/15/2022 in [place]  Total GAD-7 Score 7      PHQ2-9    [place] Visit from 06/12/2022 in [place] Video Visit from 05/12/2022 in [place] Office Visit from 05/08/2022 in [place] Office Visit from 04/15/2022 in [place] Office Visit from 06/17/2021 in [place]  PHQ-2 Total Score 1 2 4  0 1  PHQ-9 Total Score 5 6 9  -- 11      Flowsheet Row Video Visit from 05/12/2022 in [place] Office Visit from 04/15/2022 in [place] Error: Q3, 4, or 5 should not be populated when Q2 is No High Risk        Assessment and Plan:   # Depression -  recurrent and in remission  - continue with Lexapro 10 mg daily   # Generalized Anxiety disorder- Chronic and stable - As mentioned above for depression  # ADHD Chronic and improved - Stopped Dexedrine 2.5 mg BID - Pt does not feel the need to take it.   # Insomnia - Chronic and stable - Continue Trazodone 100mg  QHS  - Return to clinic in 4 weeks or early if needed.   MDM = 2 or more chronic conditions + med management [name], MD 07/07/2022, 1:25 PM

## 2022-07-17 ENCOUNTER — Encounter: Payer: Self-pay | Admitting: Family Medicine

## 2022-07-17 ENCOUNTER — Ambulatory Visit (INDEPENDENT_AMBULATORY_CARE_PROVIDER_SITE_OTHER): Payer: Commercial Managed Care - HMO | Admitting: Family Medicine

## 2022-07-17 VITALS — BP 110/70 | HR 78 | Temp 97.8°F | Ht 65.0 in | Wt 176.0 lb

## 2022-07-17 DIAGNOSIS — Z23 Encounter for immunization: Secondary | ICD-10-CM

## 2022-07-17 DIAGNOSIS — G43009 Migraine without aura, not intractable, without status migrainosus: Secondary | ICD-10-CM | POA: Diagnosis not present

## 2022-07-17 MED ORDER — CYCLOBENZAPRINE HCL 5 MG PO TABS: 5.0000 mg | ORAL_TABLET | Freq: Three times a day (TID) | ORAL | 1 refills | Status: DC | PRN

## 2022-07-17 MED ORDER — ONDANSETRON HCL 4 MG PO TABS: 4.0000 mg | ORAL_TABLET | Freq: Three times a day (TID) | ORAL | 0 refills | Status: DC | PRN

## 2022-07-17 NOTE — Patient Instructions (Signed)
Try taking zofran if needed for nausea and flexeril if needed for pain.   Sedation caution.  Update [place] if no helping or tolerated.   Let us know if you don't get a call in the next week about seeing the headache clinic. Take care.  Glad to see you.

## 2022-07-17 NOTE — Progress Notes (Unsigned)
She has altered L arm and leg motor function at baseline.   HA daily, waking up with HA, going to sleep with HA.  HA currently, better with lower lights.  Feels better in a dark quiet room.  Lives with father.  Working 2 jobs.  Was prev dx'd with migraines.  Prev on topamax but had sedation.  HA have been worse of the last few years.  No h/o aura.  FH migraines, cousin.  She has vomited from migraines prev.  Last seizure was >3 years ago.  Not on antiSZ meds currently.    No cocaine use. Some occ etoh.  No tobacco. Frequent caffeine with soda, either mello yellow or coke.    Pain near R lateral eyebrow, occ on L side.      New PCP

## 2022-07-19 NOTE — Assessment & Plan Note (Signed)
The options.  Given her frequency of headaches, I think she is going to need to see the headache clinic.  Referred.  Discussed with patient.  She agrees.  In the meantime, she can try taking zofran if needed for nausea and flexeril if needed for pain.   Sedation caution.  I asked her to update [place] if not helping or tolerated.   Since her visit summary.

## 2022-08-18 ENCOUNTER — Telehealth (INDEPENDENT_AMBULATORY_CARE_PROVIDER_SITE_OTHER): Payer: Commercial Managed Care - HMO | Admitting: Child and Adolescent Psychiatry

## 2022-08-18 DIAGNOSIS — F411 Generalized anxiety disorder: Secondary | ICD-10-CM

## 2022-08-18 DIAGNOSIS — F3341 Major depressive disorder, recurrent, in partial remission: Secondary | ICD-10-CM | POA: Diagnosis not present

## 2022-08-18 MED ORDER — ESCITALOPRAM OXALATE 10 MG PO TABS: 10.0000 mg | ORAL_TABLET | Freq: Every day | ORAL | 2 refills | Status: DC

## 2022-08-18 MED ORDER — TRAZODONE HCL 100 MG PO TABS: 100.0000 mg | ORAL_TABLET | Freq: Every day | ORAL | 0 refills | Status: DC

## 2022-08-18 NOTE — Progress Notes (Signed)
Virtual Visit via Video Note  I connected with Christina Mccall on 08/18/22 at  2:30 PM EST by a video enabled telemedicine application and verified that I am speaking with the correct person using two identifiers.  Location: Patient: home Provider: office   I discussed the limitations of evaluation and management by telemedicine and the availability of in person appointments. The patient expressed understanding and agreed to proceed.   I discussed the assessment and treatment plan with the patient. The patient was provided an opportunity to ask questions and all were answered. The patient agreed with the plan and demonstrated an understanding of the instructions.   The patient was advised to call back or seek an in-person evaluation if the symptoms worsen or if the condition fails to improve as anticipated.  I provided 20 minutes of non-face-to-face time during this encounter.   Christina Erm, MD   [facility] MD/PA/NP OP Progress Note  08/18/2022 2:56 PM Christina Mccall  MRN:  [id number]  Chief Complaint:  Medication management follow-up for mood and anxiety.   Synopsis: This is a 21 year old Caucasian female with medical history significant of left hemiparesis since 2002, generalized epilepsy and psychiatric history significant of mood disorder, generalized anxiety disorder and ADHD has been under outpatient psychiatric care at [facility] since 2015, and med trials include Effexor XR, Lamictal, Abilify, Topamax in the past, currently on Zoloft and Trazodone.   HPI:   Christina Mccall was seen and evaluated over telemedicine encounter for medication management follow-up.  At her last appointment she was recommended to continue with Lexapro 10 mg daily as well as trazodone 100 mg at night.  She tells me that she continues to work at 2 jobs, works part-time at [employer] and part-time at [employer].  She says that sometimes it is difficult for her to work because of headaches but sometimes she is glad that she  is working and earning money.  She also shares that her father told her that he would kick her out of their home if she does not work.  She reports that her father is under a lot of stress but sometimes he does not like him taking his stress out on her.  Despite this she reports that her mood has been "good", denies any low lows.  She still sleeps well, sleep has been restful, has decent energy, denies any SI or HI.  She is eating well.  She does have some intermittent anxiety but overall reports that her anxiety is manageable.  In her free time she sometimes goes to her grandmother's home to spend time with her, on menses she goes to her mom and hangs out with her.  She denies any concerns for today's appointment.  Requests for medication refills.  She would like to continue with current medications and reports that she does not have time to engage in therapy for now.  She did see her primary care doctor, and she was referred to headache clinic, she is yet to hear from them.    Visit Diagnosis:    ICD-10-CM   1. Generalized anxiety disorder  F41.1     2. Recurrent major depressive disorder, in partial remission (HCC)  F33.41 traZODone (DESYREL) 100 MG tablet         Past Psychiatric History: As mentioned in initial H&P, reviewed today, as following Patient has never been hospitalized psychiatrically, does not have any history of suicide, was previously seeing Dr. Dwyane Dee and then Dr. Darene Lamer at the [facility] outpatient clinic before transitioning  to Dr. Daleen Bo and subsequently to this writer.  She is currently not in any psychotherapy.   Past Medical History:  Past Medical History:  Diagnosis Date   Anxiety    Cerebral infarction involving right cerebellar artery (HCC)    intrauterine   CP (cerebral palsy) (HCC)    Epilepsy (HCC)    Headache(784.0)    Left hemiparesis (HCC)    Seizures (HCC)     Past Surgical History:  Procedure Laterality Date   NO PAST SURGERIES      Family Psychiatric  History: Reviewed today and as following Paternal Uncle - Depression; Mother - Anxiety, reviewed today and no change.  Family History:  Family History  Problem Relation Age of Onset   Migraines Maternal Grandfather    Alcohol abuse Mother    Anxiety disorder Father    Alcohol abuse Father     Social History:  Social History   Socioeconomic History   Marital status: Single    Spouse name: Not on file   Number of children: Not on file   Years of education: Not on file   Highest education level: Not on file  Occupational History   Not on file  Tobacco Use   Smoking status: Never   Smokeless tobacco: Never  Substance and Sexual Activity   Alcohol use: No   Drug use: No   Sexual activity: Never  Other Topics Concern   Not on file  Social History Narrative   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 She lives with her parents and siblings in separate households. She enjoys swimming, being with her family and eating ice cream.    Social Determinants of Health   Financial Resource Strain: Not on file  Food Insecurity: Not on file  Transportation Needs: Not on file  Physical Activity: Not on file  Stress: Not on file  Social Connections: Not on file    Allergies:  Allergies  Allergen Reactions   Benadryl [Diphenhydramine] Other (See Comments)    seizures    Metabolic Disorder Labs: No results found for: "HGBA1C", "MPG" No results found for: "PROLACTIN" No results found for: "CHOL", "TRIG", "HDL", "CHOLHDL", "VLDL", "LDLCALC" Lab Results  Component Value Date   TSH 1.970 03/11/2018    Therapeutic Level Labs: No results found for: "LITHIUM" No results found for: "VALPROATE" No results found for: "CBMZ"  Current Medications: Current Outpatient Medications  Medication Sig Dispense Refill   cyclobenzaprine (FLEXERIL) 5 MG tablet Take 1 tablet (5 mg total) by mouth 3 (three) times daily as needed for muscle spasms (sedation  caution). 30 tablet 1   escitalopram (LEXAPRO) 10 MG tablet Take 1 tablet (10 mg total) by mouth daily. 30 tablet 2   etonogestrel (NEXPLANON) 68 MG IMPL implant 1 each by Subdermal route once.     ondansetron (ZOFRAN) 4 MG tablet Take 1 tablet (4 mg total) by mouth every 8 (eight) hours as needed for nausea or vomiting. 20 tablet 0   traZODone (DESYREL) 100 MG tablet Take 1 tablet (100 mg total) by mouth at bedtime. 90 tablet 0   No current facility-administered medications for this visit.     Musculoskeletal:  Gait & Station:unable to assess since visit was over the telemedicine. Patient leans: unable to assess since visit was over the telemedicine.  Psychiatric Specialty Exam: ROSReview of 12 systems negative except as mentioned in HPI  There were no vitals taken for this visit.There is no height  or weight on file to calculate BMI.  General Appearance: Casual  Eye Contact:  Fair  Speech:  Clear and Coherent and Normal Rate  Volume:  Normal  Mood:   "good.."  Affect:  Appropriate, Congruent, and Full Range  Thought Process:  Goal Directed and Linear  Orientation:  Full (Time, Place, and Person)  Thought Content: Logical   Suicidal Thoughts:  No  Homicidal Thoughts:  No  Memory:  Immediate;   Fair Recent;   Fair Remote;   Fair  Judgement:  Fair  Insight:  Fair  Psychomotor Activity:  Normal  Concentration:  Concentration: Fair and Attention Span: Fair  Recall:  [employer] of Knowledge: Fair  Language: Fair  Akathisia:  NA  Handed:  Right  AIMS (if indicated): not done  Assets:  Desire for Improvement Financial Resources/Insurance Housing Social Support  ADL's:  Intact  Cognition: WNL  Sleep:   Fair   Screenings: GAD-7    Flowsheet Row Office Visit from 04/15/2022 in [place]  Total GAD-7 Score 7      PHQ2-9    [place] Visit from 06/12/2022 in [place] Video Visit from 05/12/2022 in  [place] Office Visit from 05/08/2022 in [place] Office Visit from 04/15/2022 in [place] Office Visit from 06/17/2021 in [place]  PHQ-2 Total Score 1 2 4  0 1  PHQ-9 Total Score 5 6 9  -- 11      Flowsheet Row Video Visit from 05/12/2022 in [place] Office Visit from 04/15/2022 in [place] Error: Q3, 4, or 5 should not be populated when Q2 is No High Risk        Assessment and Plan:   Plan reviewed on 11/21.  She appears to have continued remission in depressive symptoms, anxiety despite current psychosocial stressors.  Recommending to continue with current medications, she reports that she does not have time for therapy at this time.  # Depression - recurrent and in remission  - continue with Lexapro 10 mg daily   # Generalized Anxiety disorder- Chronic and stable - As mentioned above for depression  # ADHD  - Improved  # Insomnia - Chronic and stable - Continue Trazodone 100mg  QHS  - Return to clinic in 6 weeks or early if needed.   MDM = 2 or more chronic conditions + med management Christina Erm, MD 08/18/2022, 2:56 PM

## 2022-08-24 ENCOUNTER — Encounter: Payer: Self-pay | Admitting: *Deleted

## 2022-09-01 ENCOUNTER — Ambulatory Visit: Payer: Managed Care, Other (non HMO) | Admitting: Student

## 2022-09-03 ENCOUNTER — Encounter: Payer: Self-pay | Admitting: Family Medicine

## 2022-09-03 ENCOUNTER — Ambulatory Visit (INDEPENDENT_AMBULATORY_CARE_PROVIDER_SITE_OTHER): Payer: Commercial Managed Care - HMO | Admitting: Family Medicine

## 2022-09-03 VITALS — BP 102/62 | HR 90 | Wt 170.0 lb

## 2022-09-03 DIAGNOSIS — Z309 Encounter for contraceptive management, unspecified: Secondary | ICD-10-CM | POA: Diagnosis not present

## 2022-09-03 DIAGNOSIS — R4586 Emotional lability: Secondary | ICD-10-CM

## 2022-09-03 NOTE — Assessment & Plan Note (Signed)
-  extensive contraception counseling provided, explained that patient may have irregular bleeding cycles with other forms of birth control as well but patient still desiring to switch to another form. All viable forms discussed, patient desiring OCPs which I believe is appropriate as she does not have any contraindications to this  -follow up for nexplanon removal scheduled at patient's earliest convenience, plan to get OCPs at that time -instructed patient that she has to take these daily and that missing a day can result in a pregnancy, she voices understanding and is in agreement

## 2022-09-03 NOTE — Progress Notes (Signed)
    SUBJECTIVE:   CHIEF COMPLAINT / HPI:   Patient presents to discuss birth control. Just had nexplanon placed a few months ago. She has been bleeding even off her cycle and is wanting to switch to another form of contraception. She carries heavy boxes since she works at [employer] and she is worried about it coming out when she does this. Has a history of migraines without aura, denies history of blood clots and denies tobacco use.  Discussed mood since patient had positive PHQ-9. She feels that she is very stressed as her dad has been on her case about getting a job and being more responsible. Finds that her work schedule hectic and strives to have her weekends free to rest but this has been an issue due to her dad's request. Denies current thoughts of harming herself or anyone else. Denies having a plan. States that the last time she experienced suicidal ideation was about 2 years ago when her mother was diagnosed with cancer. At the time, she did not have a plan and shares that she never had a plan in place to harm herself. Compliant on lexapro daily.   OBJECTIVE:   BP 102/62   Pulse 90   Wt 170 lb (77.1 kg)   SpO2 98%   BMI 28.29 kg/m   General: Patient well-appearing, in no acute distress. CV: RRR, no murmurs or gallops auscultated Resp: CTAB, no wheezing, rales or rhonchi noted Ext: nexplanon implant palpated completely about 8 cm from left medial epicondyle  Psych: decreased mood, pleasant, denies SI without plan, denies HI   ASSESSMENT/PLAN:   Contraception management -extensive contraception counseling provided, explained that patient may have irregular bleeding cycles with other forms of birth control as well but patient still desiring to switch to another form. All viable forms discussed, patient desiring OCPs which I believe is appropriate as she does not have any contraindications to this  -follow up for nexplanon removal scheduled at patient's earliest convenience, plan to get  OCPs at that time -instructed patient that she has to take these daily and that missing a day can result in a pregnancy, she voices understanding and is in agreement  Positive PHQ-9 -noted to answer 1 for question 9, after further discussion with patient she does not endorse SI and has never had a plan in place to harm herself. Last SI was over a year ago.  -coping strategies discussed including utilizing her support system -these mood changes have been a chronic issue that seems to have improved since prior  -patient does not live in [place] and is trying to get a PCP closer to home, has follow up with new PCP 12/13. Instructed to have mood check at this time and routinely as appropriate    Reece Leader, DO [facility]

## 2022-09-03 NOTE — Patient Instructions (Signed)
It was great seeing you today!  Today we discussed your mood. I am glad that you are doing well with all the stress, if you have thoughts of harming yourself then please call [phone].   Please return to remove your nexplanon, at that visit you will also get birth control pills which you will need to take everyday. Skipping even one day can result in a pregnancy.   Please follow up at your next scheduled appointment, if anything arises between now and then, please don't hesitate to contact our office.   Thank you for allowing [place] to be a part of your medical care!  Thank you, Dr. Robyne Peers  Also a reminder of our clinic's no-show policy. Please make sure to arrive at least 15 minutes prior to your scheduled appointment time. Please try to cancel before 24 hours if you are not able to make it. If you no-show for 2 appointments then you will be receiving a warning letter. If you no-show after 3 visits, then you may be at risk of being dismissed from our clinic. This is to ensure that everyone is able to be seen in a timely manner. Thank you, we appreciate your assistance with this!

## 2022-09-07 ENCOUNTER — Encounter: Payer: Self-pay | Admitting: Family Medicine

## 2022-09-07 ENCOUNTER — Ambulatory Visit (INDEPENDENT_AMBULATORY_CARE_PROVIDER_SITE_OTHER): Payer: BC Managed Care – PPO | Admitting: Family Medicine

## 2022-09-07 VITALS — BP 106/73 | HR 75 | Ht 65.0 in | Wt 169.0 lb

## 2022-09-07 DIAGNOSIS — Z308 Encounter for other contraceptive management: Secondary | ICD-10-CM | POA: Diagnosis not present

## 2022-09-07 MED ORDER — NORGESTIMATE-ETH ESTRADIOL 0.25-35 MG-MCG PO TABS: 1.0000 | ORAL_TABLET | Freq: Every day | ORAL | 11 refills | Status: DC

## 2022-09-07 NOTE — Progress Notes (Unsigned)
    SUBJECTIVE:   CHIEF COMPLAINT / HPI:   Patient presents for nexplanon removal, she desires to have this removed and started on OCPs which was thoroughly discussed at the previous visit. Denies any other concerns today.   OBJECTIVE:   BP 106/73   Pulse 75   Ht 5\' 5"  (1.651 m)   Wt 169 lb (76.7 kg)   SpO2 99%   BMI 28.12 kg/m    General: Patient well-appearing, in no acute distress. Resp: normal work of breathing noted Ext: presence of nexplanon implant of the left medial epicondyle   PROCEDURE NOTE: Nexplanon removal Patient given informed consent, signed copy in the chart.  Appropriate time out taken  The patient's  left arm was prepped and draped in the usual sterile fashion.. The implant was palpated 8 cm from medial epicondyle of the elbow. Local anaesthesia obtained using 1.5 cc of 1% lidocaine with epinephrine. Nexplanon was inserted per manufacturer's directions. Less than 1 cc blood loss. The insertion site covered with antibiotic ointment and a pressure bandage to minimize bruising. There were no complications and the patient tolerated the procedure well.  Patient did well after the procedure. Implant firmly attached to subcutaneous fat and took time to separate the implant from the skin. Assistance from supervising attending, Dr. , who was present for part of the encounter.   ASSESSMENT/PLAN:   Contraception management -nexplanon removed successfully without complications  -OCPs prescribed -safe sex practices discussed     Deirdre Priest, DO [facility]

## 2022-09-07 NOTE — Patient Instructions (Signed)
It was great seeing you today!  Today we removed your nexplanon, please keep the dressing on for 24 hours and let us know if you notice any redness or swelling in that area. Please start taking the oral contraceptive pills today which I have prescribed, take 1 daily. Please set an alarm to help you remember, you must take 1 tablet daily. Skipping a day can result in a pregnancy.   Please follow up at your next scheduled appointment, if anything arises between now and then, please don't hesitate to contact our office.   Thank you for allowing [place] to be a part of your medical care!  Thank you, Dr. Robyne Peers  Also a reminder of our clinic's no-show policy. Please make sure to arrive at least 15 minutes prior to your scheduled appointment time. Please try to cancel before 24 hours if you are not able to make it. If you no-show for 2 appointments then you will be receiving a warning letter. If you no-show after 3 visits, then you may be at risk of being dismissed from our clinic. This is to ensure that everyone is able to be seen in a timely manner. Thank you, we appreciate your assistance with this!

## 2022-09-08 NOTE — Assessment & Plan Note (Signed)
-  nexplanon removed successfully without complications  -OCPs prescribed -safe sex practices discussed

## 2022-09-09 ENCOUNTER — Encounter: Payer: Self-pay | Admitting: Nurse Practitioner

## 2022-09-09 ENCOUNTER — Ambulatory Visit (INDEPENDENT_AMBULATORY_CARE_PROVIDER_SITE_OTHER): Payer: BC Managed Care – PPO | Admitting: Nurse Practitioner

## 2022-09-09 VITALS — BP 98/60 | HR 55 | Temp 97.5°F | Ht 64.5 in | Wt 170.0 lb

## 2022-09-09 DIAGNOSIS — R569 Unspecified convulsions: Secondary | ICD-10-CM | POA: Diagnosis not present

## 2022-09-09 DIAGNOSIS — G43009 Migraine without aura, not intractable, without status migrainosus: Secondary | ICD-10-CM

## 2022-09-09 DIAGNOSIS — E663 Overweight: Secondary | ICD-10-CM

## 2022-09-09 DIAGNOSIS — G40309 Generalized idiopathic epilepsy and epileptic syndromes, not intractable, without status epilepticus: Secondary | ICD-10-CM

## 2022-09-09 DIAGNOSIS — F3341 Major depressive disorder, recurrent, in partial remission: Secondary | ICD-10-CM

## 2022-09-09 DIAGNOSIS — G44219 Episodic tension-type headache, not intractable: Secondary | ICD-10-CM

## 2022-09-09 DIAGNOSIS — Z0000 Encounter for general adult medical examination without abnormal findings: Secondary | ICD-10-CM | POA: Diagnosis not present

## 2022-09-09 DIAGNOSIS — Z23 Encounter for immunization: Secondary | ICD-10-CM | POA: Diagnosis not present

## 2022-09-09 DIAGNOSIS — G808 Other cerebral palsy: Secondary | ICD-10-CM

## 2022-09-09 NOTE — Assessment & Plan Note (Signed)
Was seen by colleague and prescribed muscle relaxant.  Patient was referred to headache clinic and has not followed up with them as of yet.  She has been contacted.  Encourage patient to follow-up with headache clinic

## 2022-09-09 NOTE — Assessment & Plan Note (Signed)
Affecting patient's left side upper extremity more so than lower extremity

## 2022-09-09 NOTE — Patient Instructions (Signed)
Nice to see you today We did update your tetanus vaccine today Be sure to follow up and see the headache clinic I want you to come back within the next two weeks to get FASTING labs drawn. Make an apointment when you leave today

## 2022-09-09 NOTE — Assessment & Plan Note (Signed)
States this was at birth.  Not followed by neurology

## 2022-09-09 NOTE — Assessment & Plan Note (Signed)
Some relief with muscle relaxant but no resolution.  Follow-up with headache clinic as referred

## 2022-09-09 NOTE — Assessment & Plan Note (Signed)
Patient currently followed by psychiatry she is managed on Lexapro and trazodone.  Continue following up with them as recommended continue taking medication as prescribed.

## 2022-09-09 NOTE — Assessment & Plan Note (Signed)
Patient has not had a seizure in approximately 3 years per her report.  Not currently on any antiepileptic medications not currently followed by neurology

## 2022-09-09 NOTE — Progress Notes (Signed)
New Patient Office Visit  Subjective    Patient ID: Christina Mccall, female    DOB: 2001-09-14  Age: 21 y.o. MRN: [id number]  CC:  Chief Complaint  Patient presents with   New Patient (Initial Visit)    HPI Christina Mccall presents to establish care   OCP: Followed by GYN.  Patient did have Nexplanon previous and recently had a removal was started on OCP.  Migraine: Was seen by coworker, Dr. Para March. Patient was placed on muscle relaxer and then referred to a headache clinic. States taht she did hear from [facility] and will get back with them. States that she has headaches every. Right side felt like a stab that is all the time. States that she will take some headache medication that will make it go away. Does not medicaoitn every day. Does have light sensitivyt Headahces her whole life   Seizure: states last seizure has been 3 years ago. No antiseizure medications and not being followed by neurology  MDD: currently followed by Pysch and managed on Lexapro and trazodone.  Patient denies HI/SI.  She does endorse auditory hallucinations.  States psychiatry is aware  TDAP: Update today  Diet:States that it depends. 2 meals a day. Likes to snack. Water soda. Exercise: no exercise.  Eyes: has glasses. Needs updating Pap smear: Patient is followed by GYN.  Last Pap smear 06/12/2022 that was negative repeat in 3 years Dr. Terisa Starr  Sleep: States that she will go to bed around 11-12p. Gets up 9-10am. Feels a little rested. Does snore.  Has done a sleep apnea test per report it was negative.    Outpatient Encounter Medications as of 09/09/2022  Medication Sig   cyclobenzaprine (FLEXERIL) 5 MG tablet Take 1 tablet (5 mg total) by mouth 3 (three) times daily as needed for muscle spasms (sedation caution).   escitalopram (LEXAPRO) 10 MG tablet Take 1 tablet (10 mg total) by mouth daily.   norgestimate-ethinyl estradiol (SPRINTEC 28) 0.25-35 MG-MCG tablet Take 1 tablet by mouth daily.    ondansetron (ZOFRAN) 4 MG tablet Take 1 tablet (4 mg total) by mouth every 8 (eight) hours as needed for nausea or vomiting.   traZODone (DESYREL) 100 MG tablet Take 1 tablet (100 mg total) by mouth at bedtime.   No facility-administered encounter medications on file as of 09/09/2022.    Past Medical History:  Diagnosis Date   Anxiety    Cerebral infarction involving right cerebellar artery (HCC)    intrauterine   CP (cerebral palsy) (HCC)    Epilepsy (HCC)    Headache(784.0)    Left hemiparesis (HCC)    Seizures (HCC)     Past Surgical History:  Procedure Laterality Date   NO PAST SURGERIES      Family History  Problem Relation Age of Onset   Migraines Maternal Grandfather    Alcohol abuse Mother    Anxiety disorder Father    Alcohol abuse Father     Social History   Socioeconomic History   Marital status: Single    Spouse name: Not on file   Number of children: 0   Years of education: Not on file   Highest education level: Not on file  Occupational History   Not on file  Tobacco Use   Smoking status: Never    Passive exposure: Never   Smokeless tobacco: Never  Vaping Use   Vaping Use: Never used  Substance and Sexual Activity   Alcohol use: No   Drug use:  No   Sexual activity: Never  Other Topics Concern   Not on file  Social History Narrative   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 She lives with her parents and siblings in separate households. She enjoys swimming, being with her family and eating ice cream.       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       Hobbies: hanging out with the family   Live with father and step mother and step brother (younger)   Social Determinants of Corporate investment banker Strain: Not on file  Food Insecurity: Not on file  Transportation Needs: Not on file  Physical Activity: Not on file  Stress: Not on file  Social Connections: Not on  file  Intimate Partner Violence: Not on file    Review of Systems  Constitutional:  Negative for chills and fever.  Respiratory:  Negative for shortness of breath.   Cardiovascular:  Negative for chest pain.  Gastrointestinal:  Positive for constipation. Negative for abdominal pain, diarrhea, nausea and vomiting.       BM not daily. A couple days with out pooping   Neurological:  Positive for weakness and headaches. Negative for tingling.  Psychiatric/Behavioral:  Positive for hallucinations (states hears her name.  Sometimes hears people talking to her). Negative for suicidal ideas.         Objective    BP 98/60 (BP Location: Left Arm, Patient Position: Sitting, Cuff Size: Normal)   Pulse (!) 55   Temp (!) 97.5 F (36.4 C) (Temporal)   Ht 5' 4.5" (1.638 m)   Wt 170 lb (77.1 kg)   SpO2 97%   BMI 28.73 kg/m   Physical Exam Vitals and nursing note reviewed.  Constitutional:      Appearance: Normal appearance.  HENT:     Right Ear: Tympanic membrane, ear canal and external ear normal.     Left Ear: Tympanic membrane, ear canal and external ear normal.     Mouth/Throat:     Mouth: Mucous membranes are moist.     Pharynx: Oropharynx is clear.  Eyes:     Extraocular Movements: Extraocular movements intact.     Pupils: Pupils are equal, round, and reactive to light.     Comments: Wears glasses  Cardiovascular:     Rate and Rhythm: Normal rate and regular rhythm.     Pulses: Normal pulses.     Heart sounds: Normal heart sounds.  Pulmonary:     Effort: Pulmonary effort is normal.     Breath sounds: Normal breath sounds.  Abdominal:     General: Bowel sounds are normal. There is no distension.     Palpations: There is no mass.     Tenderness: There is no abdominal tenderness.     Hernia: No hernia is present.  Musculoskeletal:     Right lower leg: No edema.     Left lower leg: No edema.  Lymphadenopathy:     Cervical: No cervical adenopathy.  Skin:    General: Skin  is warm.  Neurological:     Mental Status: She is alert. Mental status is at baseline.     Comments: Left sided grip 3/5. Left sided wrist contracture          Assessment & Plan:   Problem List Items Addressed This Visit       Cardiovascular and Mediastinum   Migraine  without aura (Chronic)    Was seen by colleague and prescribed muscle relaxant.  Patient was referred to headache clinic and has not followed up with them as of yet.  She has been contacted.  Encourage patient to follow-up with headache clinic      Relevant Orders   TSH     Nervous and Auditory   Generalized convulsive epilepsy (HCC) (Chronic)    Patient has not had a seizure in approximately 3 years per her report.  Not currently on any antiepileptic medications not currently followed by neurology      Congenital hemiplegia (HCC) (Chronic)    Affecting patient's left side upper extremity more so than lower extremity        Other   Episodic tension-type headache (Chronic)    Some relief with muscle relaxant but no resolution.  Follow-up with headache clinic as referred      Seizure [facility])   Relevant Orders   TSH   Preventative health care - Primary    Discussed age-appropriate immunizations and screening exams.  Patient's Pap smear up-to-date too young for mammogram and colonoscopy.  Update patient's tetanus shot today.  Patient was given information at dismissal about preventative healthcare maintenance with anticipatory guidance for patients in her age range.  Currently followed by GYN on oral contraception      Relevant Orders   CBC   Comprehensive metabolic panel   Lipid panel   TSH   Recurrent major depressive disorder, in partial remission [facility])    Patient currently followed by psychiatry she is managed on Lexapro and trazodone.  Continue following up with them as recommended continue taking medication as prescribed.      Overweight   Relevant Orders   Lipid panel   TSH   Other Visit  Diagnoses     Need for tetanus booster       Relevant Orders   Td : Tetanus/diphtheria >7yo Preservative  free (Completed)       Return in about 1 year (around 09/10/2023) for CPE and Labs.   Audria Nine, NP

## 2022-09-09 NOTE — Assessment & Plan Note (Signed)
Discussed age-appropriate immunizations and screening exams.  Patient's Pap smear up-to-date too young for mammogram and colonoscopy.  Update patient's tetanus shot today.  Patient was given information at dismissal about preventative healthcare maintenance with anticipatory guidance for patients in her age range.  Currently followed by GYN on oral contraception

## 2022-09-23 ENCOUNTER — Other Ambulatory Visit (INDEPENDENT_AMBULATORY_CARE_PROVIDER_SITE_OTHER): Payer: BC Managed Care – PPO

## 2022-09-23 DIAGNOSIS — R569 Unspecified convulsions: Secondary | ICD-10-CM

## 2022-09-23 DIAGNOSIS — E663 Overweight: Secondary | ICD-10-CM

## 2022-09-23 DIAGNOSIS — G43009 Migraine without aura, not intractable, without status migrainosus: Secondary | ICD-10-CM

## 2022-09-23 DIAGNOSIS — Z0000 Encounter for general adult medical examination without abnormal findings: Secondary | ICD-10-CM

## 2022-09-23 LAB — LIPID PANEL
Cholesterol: 159 mg/dL (ref 0–200)
HDL: 45.9 mg/dL (ref >39.00)
LDL Cholesterol: 97 mg/dL (ref 0–99)
NonHDL: 113.18
Total CHOL/HDL Ratio: 3
Triglycerides: 82 mg/dL (ref 0.0–149.0)
VLDL: 16.4 mg/dL (ref 0.0–40.0)

## 2022-09-23 LAB — CBC
HCT: 38.8 % (ref 36.0–46.0)
Hemoglobin: 13 g/dL (ref 12.0–15.0)
MCHC: 33.6 g/dL (ref 30.0–36.0)
MCV: 84.5 fl (ref 78.0–100.0)
Platelets: 240 10*3/uL (ref 150.0–400.0)
RBC: 4.59 Mil/uL (ref 3.87–5.11)
RDW: 13.8 % (ref 11.5–15.5)
WBC: 8 10*3/uL (ref 4.0–10.5)

## 2022-09-23 LAB — TSH: TSH: 3.6 u[IU]/mL (ref 0.35–5.50)

## 2022-09-23 LAB — COMPREHENSIVE METABOLIC PANEL
ALT: 20 U/L (ref 0–35)
AST: 13 U/L (ref 0–37)
Albumin: 4 g/dL (ref 3.5–5.2)
Alkaline Phosphatase: 65 U/L (ref 39–117)
BUN: 13 mg/dL (ref 6–23)
CO2: 24 mEq/L (ref 19–32)
Calcium: 9.1 mg/dL (ref 8.4–10.5)
Chloride: 107 mEq/L (ref 96–112)
Creatinine, Ser: 0.92 mg/dL (ref 0.40–1.20)
GFR: 88.13 mL/min (ref >60.00)
Glucose, Bld: 99 mg/dL (ref 70–99)
Potassium: 4 mEq/L (ref 3.5–5.1)
Sodium: 138 mEq/L (ref 135–145)
Total Bilirubin: 0.4 mg/dL (ref 0.2–1.2)
Total Protein: 6.4 g/dL (ref 6.0–8.3)

## 2022-10-01 ENCOUNTER — Telehealth (INDEPENDENT_AMBULATORY_CARE_PROVIDER_SITE_OTHER): Payer: Commercial Managed Care - HMO | Admitting: Child and Adolescent Psychiatry

## 2022-10-01 DIAGNOSIS — F3341 Major depressive disorder, recurrent, in partial remission: Secondary | ICD-10-CM

## 2022-10-01 DIAGNOSIS — F411 Generalized anxiety disorder: Secondary | ICD-10-CM

## 2022-10-01 NOTE — Progress Notes (Signed)
Virtual Visit via Video Note  I connected with Christina Mccall on 10/01/22 at  1:00 PM EST by a video enabled telemedicine application and verified that I am speaking with the correct person using two identifiers.  Location: Patient: home Provider: office   I discussed the limitations of evaluation and management by telemedicine and the availability of in person appointments. The patient expressed understanding and agreed to proceed.   I discussed the assessment and treatment plan with the patient. The patient was provided an opportunity to ask questions and all were answered. The patient agreed with the plan and demonstrated an understanding of the instructions.   The patient was advised to call back or seek an in-person evaluation if the symptoms worsen or if the condition fails to improve as anticipated.  I provided 20 minutes of non-face-to-face time during this encounter.   [name], MD   [facility] MD/PA/NP OP Progress Note  10/01/2022 1:23 PM Christina Mccall  MRN:  [id number]  Chief Complaint:  Medication management follow-up for mood and anxiety.   Synopsis: This is a 22 year old Caucasian female with medical history significant of left hemiparesis since 2002, generalized epilepsy and psychiatric history significant of mood disorder, generalized anxiety disorder and ADHD has been under outpatient psychiatric care at [facility] since 2015, and med trials include Effexor XR, Lamictal, Abilify, Topamax in the past, currently on Zoloft and Trazodone.   HPI:   Christina Mccall was seen and evaluated over telemedicine encounter for medication management follow-up.  She was present by herself and was evaluated alone.  She tells me that she is doing "good", had good time during the Christmas with her family, her mood has been "good", denies any low lows or depressed mood.  She reports that in her free time when she is not working, she hangs out with her grandmother and her mother, plays with puzzle and  enjoys these activities.  She says that she has been sleeping well, takes trazodone every night.  She denies problems with appetite.  She denies any suicidal thoughts or homicidal thoughts.  She says that she has decent energy and denies problems with concentration.  She denies excessive worries or anxiety.  She says that she has been working at [employer], sometimes she does not like to go to work because work is tiring however she enjoys socializing with her colleagues and her colleagues like her.  She is still employed at [employer] but they have been cutting her hours and saying to her that she is slow and therefore she believes that she will be fired from the work.  She says that her father can be on her for the work and wants her to save money.  We discussed that she has been brave to go to work despite them being uncomfortable, and encouraged her to continue to work as in the past when she was not working she was spending more time at home and that was also not helpful with her mental health.  She was receptive to this.  She says that she has been dating someone whom she met through Facebook dating website.  We discussed the importance to not over shared things with people who she meets online, she says that she knows this and being careful. Because of her stability in symptoms we discussed to continue with current medications.  I discussed therapy recommendations, discussed that the clinic will have a new therapist starting next month, she however is not inclined to therapy at this time because she  feels she is doing well.  Visit Diagnosis:    ICD-10-CM   1. Generalized anxiety disorder  F41.1     2. Recurrent major depressive disorder, in partial remission ([place])  F33.41           Past Psychiatric History: As mentioned in initial H&P, reviewed today, as following Patient has never been hospitalized psychiatrically, does not have any history of suicide, was previously seeing Dr. Dwyane Dee and then Dr. Darene Lamer  at the [facility] outpatient clinic before transitioning to Dr. [name] and subsequently to this writer.  She is currently not in any psychotherapy.   Past Medical History:  Past Medical History:  Diagnosis Date   Anxiety    Cerebral infarction involving right cerebellar artery ([place])    intrauterine   CP (cerebral palsy) ([place])    Epilepsy ([place])    Headache(784.0)    Left hemiparesis ([place])    Seizures ([place])     Past Surgical History:  Procedure Laterality Date   NO PAST SURGERIES      Family Psychiatric History: Reviewed today and as following Paternal Uncle - Depression; Mother - Anxiety, reviewed today and no change.  Family History:  Family History  Problem Relation Age of Onset   Migraines Maternal Grandfather    Alcohol abuse Mother    Anxiety disorder Father    Alcohol abuse Father     Social History:  Social History   Socioeconomic History   Marital status: Single    Spouse name: Not on file   Number of children: 0   Years of education: Not on file   Highest education level: Not on file  Occupational History   Not on file  Tobacco Use   Smoking status: Never    Passive exposure: Never   Smokeless tobacco: Never  Vaping Use   Vaping Use: Never used  Substance and Sexual Activity   Alcohol use: No   Drug use: No   Sexual activity: Never  Other Topics Concern   Not on file  Social History Narrative   Christina Mccall is a 9th grade student at [employer]; she does well in school. She lives with her parents and siblings in separate households. She enjoys swimming, being with her family and eating ice cream.       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       Hobbies: hanging out with the family   Live with father and step mother and step brother (younger)   Social Determinants of Radio broadcast assistant Strain: Not on file  Food Insecurity: Not on file  Transportation Needs: Not on file  Physical Activity: Not on file   Stress: Not on file  Social Connections: Not on file    Allergies:  Allergies  Allergen Reactions   Benadryl [Diphenhydramine] Other (See Comments)    seizures    Metabolic Disorder Labs: No results found for: "HGBA1C", "MPG" No results found for: "PROLACTIN" Lab Results  Component Value Date   CHOL 159 09/23/2022   TRIG 82.0 09/23/2022   HDL 45.90 09/23/2022   CHOLHDL 3 09/23/2022   VLDL 16.4 09/23/2022   LDLCALC 97 09/23/2022   Lab Results  Component Value Date   TSH 3.60 09/23/2022   TSH 1.970 03/11/2018    Therapeutic Level Labs: No results found for: "LITHIUM" No results found for: "VALPROATE" No results found for: "CBMZ"  Current Medications: Current Outpatient Medications  Medication Sig Dispense Refill  escitalopram (LEXAPRO) 10 MG tablet Take 1 tablet (10 mg total) by mouth daily. 30 tablet 2   norgestimate-ethinyl estradiol (SPRINTEC 28) 0.25-35 MG-MCG tablet Take 1 tablet by mouth daily. 28 tablet 11   traZODone (DESYREL) 100 MG tablet Take 1 tablet (100 mg total) by mouth at bedtime. 90 tablet 0   No current facility-administered medications for this visit.     Musculoskeletal:  Gait & Station:unable to assess since visit was over the telemedicine. Patient leans: unable to assess since visit was over the telemedicine.  Psychiatric Specialty Exam: ROSReview of 12 systems negative except as mentioned in HPI  There were no vitals taken for this visit.There is no height or weight on file to calculate BMI.  General Appearance: Casual  Eye Contact:  Fair  Speech:  Clear and Coherent and Normal Rate  Volume:  Normal  Mood:   "good.."  Affect:  Appropriate, Congruent, and Full Range  Thought Process:  Goal Directed and Linear  Orientation:  Full (Time, Place, and Person)  Thought Content: Logical   Suicidal Thoughts:  No  Homicidal Thoughts:  No  Memory:  Immediate;   Fair Recent;   Fair Remote;   Fair  Judgement:  Fair  Insight:  Fair   Psychomotor Activity:  Normal  Concentration:  Concentration: Fair and Attention Span: Fair  Recall:  [employer] of Knowledge: Fair  Language: Fair  Akathisia:  NA  Handed:  Right  AIMS (if indicated): not done  Assets:  Desire for Improvement Financial Resources/Insurance Housing Social Support  ADL's:  Intact  Cognition: WNL  Sleep:   Fair   Screenings: GAD-7    Flowsheet Row Office Visit from 04/15/2022 in [place]  Total GAD-7 Score 7      PHQ2-9    [place] Visit from 09/07/2022 in [place] Office Visit from 09/03/2022 in [place] Office Visit from 06/12/2022 in [place] Video Visit from 05/12/2022 in [place] Office Visit from 05/08/2022 in [place]  PHQ-2 Total Score _0 PHQ-9 Total Score _1 Flowsheet Row Video Visit from 05/12/2022 in [place] Office Visit from 04/15/2022 in [place] Error: Q3, 4, or 5 should not be populated when Q2 is No High Risk        Assessment and Plan:   Plan reviewed on 10/01/22.  Reports suggest continued remission in depressive symptoms, stability with anxiety and therefore recommending to continue with current medications as mentioned below in the plan.  She is not agreeable to therapy recommendations at this time.    # Depression - recurrent and in remission  - continue with Lexapro 10 mg daily   # Generalized Anxiety disorder- Chronic and stable - As mentioned above for depression  # ADHD  - Improved  # Insomnia - Chronic and stable - Continue Trazodone 179m QHS  - Return to clinic in 6 weeks or early if needed.   MDM = 2 or more chronic conditions + med management  HOrlene Erm MD 10/01/2022, 1:23 PM

## 2022-11-04 ENCOUNTER — Other Ambulatory Visit: Payer: Self-pay | Admitting: Child and Adolescent Psychiatry

## 2022-11-04 ENCOUNTER — Other Ambulatory Visit: Payer: Self-pay | Admitting: Family Medicine

## 2022-11-04 DIAGNOSIS — F3341 Major depressive disorder, recurrent, in partial remission: Secondary | ICD-10-CM

## 2022-11-20 ENCOUNTER — Telehealth (INDEPENDENT_AMBULATORY_CARE_PROVIDER_SITE_OTHER): Payer: Commercial Managed Care - HMO | Admitting: Child and Adolescent Psychiatry

## 2022-11-20 DIAGNOSIS — F3341 Major depressive disorder, recurrent, in partial remission: Secondary | ICD-10-CM | POA: Diagnosis not present

## 2022-11-20 DIAGNOSIS — F411 Generalized anxiety disorder: Secondary | ICD-10-CM | POA: Diagnosis not present

## 2022-11-20 MED ORDER — ESCITALOPRAM OXALATE 10 MG PO TABS: 10.0000 mg | ORAL_TABLET | Freq: Every day | ORAL | 2 refills | Status: DC

## 2022-11-20 NOTE — Progress Notes (Signed)
Virtual Visit via Video Note  I connected with Christina Mccall on 11/20/22 at  9:30 AM EST by a video enabled telemedicine application and verified that I am speaking with the correct person using two identifiers.  Location: Patient: home Provider: office   I discussed the limitations of evaluation and management by telemedicine and the availability of in person appointments. The patient expressed understanding and agreed to proceed.   I discussed the assessment and treatment plan with the patient. The patient was provided an opportunity to ask questions and all were answered. The patient agreed with the plan and demonstrated an understanding of the instructions.   The patient was advised to call back or seek an in-person evaluation if the symptoms worsen or if the condition fails to improve as anticipated.  I provided 20 minutes of non-face-to-face time during this encounter.   [name], MD   [facility] MD/PA/NP OP Progress Note  11/20/2022 10:08 AM [name]  MRN:  [id number]  Chief Complaint:  Medication management follow-up for mood and anxiety.   Synopsis: This is a 22 year old Caucasian female with medical history significant of left hemiparesis since 2002, generalized epilepsy and psychiatric history significant of mood disorder, generalized anxiety disorder and ADHD has been under outpatient psychiatric care at [facility] since 2015, and med trials include Effexor XR, Lamictal, Abilify, Topamax in the past, currently on Zoloft and Trazodone.   HPI:   Christina Mccall was seen and evaluated over telemedicine encounter for medication management follow-up.  She was present by herself and was evaluated alone.  She tells me that she has continued to do well, continues to work part-time at [employer], works for somewhere between 2 to 3 hours every shift.  She states that she is doing well with her mood, denies any low lows or depressive episodes since the last appointment.  She reports  that anxiety can intermittently go up when she is at work but she is able to manage it well.  At home she spends time hanging out with her family, watching TV and she enjoys these activities.  She denies any suicidal thoughts or homicidal thoughts.  She says that she is sleeping well, energy is decent.  She does report that her appetite fluctuates depending on the day.  She reports that she has been consistently taking her medications, trazodone continues to help her with sleep.  Because of the stability in her symptoms we discussed to continue with current medications and follow back again in about 2 or 3 months or earlier if needed.   Visit Diagnosis:    ICD-10-CM   1. Recurrent major depressive disorder, in partial remission ([name])  F33.41     2. Generalized anxiety disorder  F41.1            Past Psychiatric History: As mentioned in initial H&P, reviewed today, as following Patient has never been hospitalized psychiatrically, does not have any history of suicide, was previously seeing Dr. Dwyane Dee and then Dr. Darene Lamer at the [facility] outpatient clinic before transitioning to Dr. [name] and subsequently to this writer.  She is currently not in any psychotherapy.   Past Medical History:  Past Medical History:  Diagnosis Date   Anxiety    Cerebral infarction involving right cerebellar artery ([name])    intrauterine   CP (cerebral palsy) ([name])    Epilepsy ([name])    Headache(784.0)    Left hemiparesis ([name])    Seizures ([name])     Past Surgical History:  Procedure Laterality  Date   NO PAST SURGERIES      Family Psychiatric History: Reviewed today and as following Paternal Uncle - Depression; Mother - Anxiety, reviewed today and no change.  Family History:  Family History  Problem Relation Age of Onset   Migraines Maternal Grandfather    Alcohol abuse Mother    Anxiety disorder Father    Alcohol abuse Father     Social History:  Social History   Socioeconomic History   Marital status:  Single    Spouse name: Not on file   Number of children: 0   Years of education: Not on file   Highest education level: Not on file  Occupational History   Not on file  Tobacco Use   Smoking status: Never    Passive exposure: Never   Smokeless tobacco: Never  Vaping Use   Vaping Use: Never used  Substance and Sexual Activity   Alcohol use: No   Drug use: No   Sexual activity: Never  Other Topics Concern   Not on file  Social History Narrative   [name] is a 9th grade student at [employer]; she does well in school. She lives with her parents and siblings in separate households. She enjoys swimming, being with her family and eating ice cream.       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       Hobbies: hanging out with the family   Live with father and step mother and step brother (younger)   Social Determinants of Radio broadcast assistant Strain: Not on file  Food Insecurity: Not on file  Transportation Needs: Not on file  Physical Activity: Not on file  Stress: Not on file  Social Connections: Not on file    Allergies:  Allergies  Allergen Reactions   Benadryl [Diphenhydramine] Other (See Comments)    seizures    Metabolic Disorder Labs: No results found for: "HGBA1C", "MPG" No results found for: "PROLACTIN" Lab Results  Component Value Date   CHOL 159 09/23/2022   TRIG 82.0 09/23/2022   HDL 45.90 09/23/2022   CHOLHDL 3 09/23/2022   VLDL 16.4 09/23/2022   LDLCALC 97 09/23/2022   Lab Results  Component Value Date   TSH 3.60 09/23/2022   TSH 1.970 03/11/2018    Therapeutic Level Labs: No results found for: "LITHIUM" No results found for: "VALPROATE" No results found for: "CBMZ"  Current Medications: Current Outpatient Medications  Medication Sig Dispense Refill   escitalopram (LEXAPRO) 10 MG tablet Take 1 tablet (10 mg total) by mouth daily. 30 tablet 2   norgestimate-ethinyl estradiol (SPRINTEC 28)  0.25-35 MG-MCG tablet Take 1 tablet by mouth daily. 28 tablet 11   traZODone (DESYREL) 100 MG tablet TAKE 1 TABLET BY MOUTH EVERYDAY AT BEDTIME 90 tablet 0   No current facility-administered medications for this visit.     Musculoskeletal:  Gait & Station:unable to assess since visit was over the telemedicine. Patient leans: unable to assess since visit was over the telemedicine.  Psychiatric Specialty Exam: ROSReview of 12 systems negative except as mentioned in HPI  There were no vitals taken for this visit.There is no height or weight on file to calculate BMI.  General Appearance: Casual  Eye Contact:  Fair  Speech:  Clear and Coherent and Normal Rate  Volume:  Normal  Mood:   "good.."  Affect:  Appropriate, Congruent, and Full Range  Thought Process:  Goal Directed and Linear  Orientation:  Full (Time, Place, and Person)  Thought Content: Logical   Suicidal Thoughts:  No  Homicidal Thoughts:  No  Memory:  Immediate;   Fair Recent;   Fair Remote;   Fair  Judgement:  Fair  Insight:  Fair  Psychomotor Activity:  Normal  Concentration:  Concentration: Fair and Attention Span: Fair  Recall:  [employer] of Knowledge: Fair  Language: Fair  Akathisia:  NA  Handed:  Right  AIMS (if indicated): not done  Assets:  Desire for Improvement Financial Resources/Insurance Housing Leisure Time Social Support  ADL's:  Intact  Cognition: WNL  Sleep:   Fair   Screenings: GAD-7    Personnel officer Visit from 04/15/2022 in [place]  Total GAD-7 Score 7      PHQ2-9    [place] Office Visit from 09/07/2022 in [place] Office Visit from 09/03/2022 in [place] Office Visit from 06/12/2022 in [place] Video Visit from 05/12/2022 in [place] Office Visit from 05/08/2022 in [place]  PHQ-2  Total Score '3 2 1 2 4  '$ PHQ-9 Total Score '8 9 5 6 9      '$ Flowsheet Row Video Visit from 05/12/2022 in [place] Office Visit from 04/15/2022 in [place] Error: Q3, 4, or 5 should not be populated when Q2 is No High Risk        Assessment and Plan:   Plan reviewed on 11/20/22.  Reports appears to continue and did suggest remission in depressive symptoms, stability with anxiety and therefore recommending to continue with current medications.  Plan as mentioned below.      # Depression - recurrent and in remission - continue with Lexapro 10 mg daily  # Generalized Anxiety disorder- Chronic and stable - As mentioned above for depression  # ADHD  - Improved  # Insomnia - Chronic and stable - Continue Trazodone '100mg'$  QHS  - Return to clinic in 8-10 weeks or early if needed.   MDM = 2 or more chronic conditions + med management  [name], MD 11/20/2022, 10:08 AM

## 2023-01-02 ENCOUNTER — Other Ambulatory Visit: Payer: Self-pay | Admitting: Child and Adolescent Psychiatry

## 2023-01-16 ENCOUNTER — Other Ambulatory Visit: Payer: Self-pay | Admitting: Child and Adolescent Psychiatry

## 2023-01-16 DIAGNOSIS — F3341 Major depressive disorder, recurrent, in partial remission: Secondary | ICD-10-CM

## 2023-01-21 ENCOUNTER — Telehealth (INDEPENDENT_AMBULATORY_CARE_PROVIDER_SITE_OTHER): Payer: Medicaid Other | Admitting: Child and Adolescent Psychiatry

## 2023-01-21 DIAGNOSIS — F411 Generalized anxiety disorder: Secondary | ICD-10-CM

## 2023-01-21 DIAGNOSIS — F3341 Major depressive disorder, recurrent, in partial remission: Secondary | ICD-10-CM

## 2023-01-21 NOTE — Progress Notes (Signed)
Virtual Visit via Video Note  I connected with Christina Mccall on 01/21/23 at 10:00 AM EDT by a video enabled telemedicine application and verified that I am speaking with the correct person using two identifiers.  Location: Patient: home Provider: office   I discussed the limitations of evaluation and management by telemedicine and the availability of in person appointments. The patient expressed understanding and agreed to proceed.   I discussed the assessment and treatment plan with the patient. The patient was provided an opportunity to ask questions and all were answered. The patient agreed with the plan and demonstrated an understanding of the instructions.   The patient was advised to call back or seek an in-person evaluation if the symptoms worsen or if the condition fails to improve as anticipated.    Darcel Smalling, MD   [name] Progress Note  01/21/2023 10:18 AM [name]  MRN:  [id number]  Chief Complaint:  Medication management follow-up for mood and anxiety.   Synopsis: This is a 22 year old Caucasian female with medical history significant of left hemiparesis since 2002, generalized epilepsy and psychiatric history significant of mood disorder, generalized anxiety disorder and ADHD has been under outpatient psychiatric care at [facility] since 2015, and med trials include Effexor XR, Lamictal, Abilify, Topamax in the past, currently on Zoloft and Trazodone.   HPI:   Christina Mccall was seen and evaluated over telemedicine encounter for medication management follow-up.  She was present by herself and was evaluated alone.  She reports that she has been doing "great".  She reports that she is now in a relationship with her boyfriend since last 1 month and that has been going very well.  She says that she has not been feeling depressed lately, denies excessive worries or anxiety.  She continues to work at [employer] and in her free time she spends time with her  family, hangs out with her mother.  She denies any problems with sleep, appetite or energy.  She denies any SI or HI.  She tells me that she has not been taking her Lexapro and asks if she can take it as needed if she needs it for feeling depressed or anxious.  Psychoeducation was provided on how antidepressant medication works, and because of her history of relapse in her depression in the past recommended to restart Lexapro 10 mg daily.  She verbalized understanding and agreed with this plan.  She continues to take trazodone 100 mg at night for sleep.  She will follow-up again in about 2 to 3 months or earlier if needed.  Visit Diagnosis:    ICD-10-CM   1. Recurrent major depressive disorder, in partial remission  F33.41     2. Generalized anxiety disorder  F41.1            Past Psychiatric History: As mentioned in initial H&P, reviewed today, as following Patient has never been hospitalized psychiatrically, does not have any history of suicide, was previously seeing Dr. Lucianne Muss and then Dr. Karie Schwalbe at the [facility] outpatient clinic before transitioning to Dr. Daleen Bo and subsequently to this writer.  She is currently not in any psychotherapy.   Past Medical History:  Past Medical History:  Diagnosis Date   Anxiety    Cerebral infarction involving right cerebellar artery (HCC)    intrauterine   CP (cerebral palsy) (HCC)    Epilepsy (HCC)    Headache(784.0)    Left hemiparesis (HCC)    Seizures (HCC)     Past Surgical  History:  Procedure Laterality Date   NO PAST SURGERIES      Family Psychiatric History: Reviewed today and as following Paternal Uncle - Depression; Mother - Anxiety, reviewed today and no change.  Family History:  Family History  Problem Relation Age of Onset   Migraines Maternal Grandfather    Alcohol abuse Mother    Anxiety disorder Father    Alcohol abuse Father     Social History:  Social History   Socioeconomic History   Marital status: Single    Spouse  name: Not on file   Number of children: 0   Years of education: Not on file   Highest education level: Not on file  Occupational History   Not on file  Tobacco Use   Smoking status: Never    Passive exposure: Never   Smokeless tobacco: Never  Vaping Use   Vaping Use: Never used  Substance and Sexual Activity   Alcohol use: No   Drug use: No   Sexual activity: Never  Other Topics Concern   Not on file  Social History Narrative   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 She lives with her parents and siblings in separate households. She enjoys swimming, being with her family and eating ice cream.       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       Hobbies: hanging out with the family   Live with father and step mother and step brother (younger)   Social Determinants of Corporate investment banker Strain: Not on file  Food Insecurity: Not on file  Transportation Needs: Not on file  Physical Activity: Not on file  Stress: Not on file  Social Connections: Not on file    Allergies:  Allergies  Allergen Reactions   Benadryl [Diphenhydramine] Other (See Comments)    seizures    Metabolic Disorder Labs: No results found for: "HGBA1C", "MPG" No results found for: "PROLACTIN" Lab Results  Component Value Date   CHOL 159 09/23/2022   TRIG 82.0 09/23/2022   HDL 45.90 09/23/2022   CHOLHDL 3 09/23/2022   VLDL 16.4 09/23/2022   LDLCALC 97 09/23/2022   Lab Results  Component Value Date   TSH 3.60 09/23/2022   TSH 1.970 03/11/2018    Therapeutic Level Labs: No results found for: "LITHIUM" No results found for: "VALPROATE" No results found for: "CBMZ"  Current Medications: Current Outpatient Medications  Medication Sig Dispense Refill   escitalopram (LEXAPRO) 10 MG tablet TAKE 1 TABLET BY MOUTH EVERY DAY 90 tablet 1   norgestimate-ethinyl estradiol (SPRINTEC 28) 0.25-35 MG-MCG tablet Take 1 tablet  by mouth daily. 28 tablet 11   traZODone (DESYREL) 100 MG tablet TAKE 1 TABLET BY MOUTH EVERYDAY AT BEDTIME 90 tablet 0   No current facility-administered medications for this visit.     Musculoskeletal:  Gait & Station:unable to assess since visit was over the telemedicine. Patient leans: unable to assess since visit was over the telemedicine.  Psychiatric Specialty Exam: ROSReview of 12 systems negative except as mentioned in HPI  There were no vitals taken for this visit.There is no height or weight on file to calculate BMI.  General Appearance: Casual  Eye Contact:  Fair  Speech:  Clear and Coherent and Normal Rate  Volume:  Normal  Mood:   "good..."  Affect:  Appropriate, Congruent, and Full Range  Thought Process:  Goal Directed  and Linear  Orientation:  Full (Time, Place, and Person)  Thought Content: Logical   Suicidal Thoughts:  No  Homicidal Thoughts:  No  Memory:  Immediate;   Fair Recent;   Fair Remote;   Fair  Judgement:  Fair  Insight:  Fair  Psychomotor Activity:  Normal  Concentration:  Concentration: Fair and Attention Span: Fair  Recall:  [employer] of Knowledge: Fair  Language: Fair  Akathisia:  NA  Handed:  Right  AIMS (if indicated): not done  Assets:  Desire for Improvement Financial Resources/Insurance Housing Leisure Time Social Support  ADL's:  Intact  Cognition: WNL  Sleep:   Fair   Screenings: GAD-7    Garment/textile technologist Visit from 04/15/2022 in [facility]  Total GAD-7 Score 7      PHQ2-9    Flowsheet Row Office Visit from 09/07/2022 in [facility] Office Visit from 09/03/2022 in [facility] Office Visit from 06/12/2022 in [facility] Video Visit from 05/12/2022 in [facility] Office Visit from 05/08/2022 in [facility]  PHQ-2 Total Score PHQ-9 Total  Score Flowsheet Row Video Visit from 05/12/2022 in [facility] Office Visit from 04/15/2022 in [facility]  C-SSRS RISK CATEGORY Error: Q3, 4, or 5 should not be populated when Q2 is No High Risk        Assessment and Plan:   Plan reviewed on 01/21/23.  Reviewed response to current medications.  She apparently has discontinued taking Lexapro for the past about a 1 to 2 months.  Despite that she reports remission and depressive symptoms and stability with anxiety.  Because of her history of relapse in her depressive symptoms in the past, recommended to restart taking Lexapro to which she agrees.  They will follow-up again in about 2 to 3 months or earlier if needed.  Plan as mentioned below.    # Depression - recurrent and in remission - continue with Lexapro 10 mg daily  # Generalized Anxiety disorder- Chronic and stable - As mentioned above for depression  # ADHD  - Improved  # Insomnia - Chronic and stable - Continue Trazodone  QHS  - Return to clinic in 8-10 weeks or early if needed.   MDM = 2 or more chronic conditions + med management  Darcel Smalling, MD 01/21/2023, 10:18 AM

## 2023-03-15 ENCOUNTER — Other Ambulatory Visit: Payer: Self-pay | Admitting: Child and Adolescent Psychiatry

## 2023-03-15 DIAGNOSIS — F3341 Major depressive disorder, recurrent, in partial remission: Secondary | ICD-10-CM

## 2023-03-15 MED ORDER — TRAZODONE HCL 100 MG PO TABS: 100.0000 mg | ORAL_TABLET | Freq: Every day | ORAL | 0 refills | Status: DC

## 2023-03-15 NOTE — Telephone Encounter (Signed)
From: Kae Heller To: Office of Darcel Smalling, MD Sent: 03/15/2023 2:17 PM EDT Subject: Medication Renewal Request  Refills have been requested for the following medications:   traZODone (DESYREL) 100 MG tablet Enrigue Catena Aime Carreras]  Preferred pharmacy: [id number] - [place], [place] - [address] Delivery method: Daryll Drown

## 2023-03-30 ENCOUNTER — Other Ambulatory Visit (HOSPITAL_COMMUNITY): Payer: Self-pay | Admitting: Psychiatry

## 2023-03-30 DIAGNOSIS — F3341 Major depressive disorder, recurrent, in partial remission: Secondary | ICD-10-CM

## 2023-03-30 MED ORDER — TRAZODONE HCL 100 MG PO TABS: 100.0000 mg | ORAL_TABLET | Freq: Every day | ORAL | 0 refills | Status: DC

## 2023-04-20 ENCOUNTER — Encounter: Payer: Self-pay | Admitting: Family

## 2023-04-20 ENCOUNTER — Ambulatory Visit: Admission: RE | Admit: 2023-04-20 | Payer: BC Managed Care – PPO | Source: Ambulatory Visit

## 2023-04-20 ENCOUNTER — Ambulatory Visit (INDEPENDENT_AMBULATORY_CARE_PROVIDER_SITE_OTHER): Payer: BC Managed Care – PPO | Admitting: Family

## 2023-04-20 VITALS — BP 118/68 | HR 88 | Temp 97.2°F | Ht 64.5 in | Wt 163.0 lb

## 2023-04-20 DIAGNOSIS — R1084 Generalized abdominal pain: Secondary | ICD-10-CM | POA: Diagnosis not present

## 2023-04-20 DIAGNOSIS — R109 Unspecified abdominal pain: Secondary | ICD-10-CM | POA: Diagnosis not present

## 2023-04-20 DIAGNOSIS — I878 Other specified disorders of veins: Secondary | ICD-10-CM | POA: Diagnosis not present

## 2023-04-20 DIAGNOSIS — K5900 Constipation, unspecified: Secondary | ICD-10-CM | POA: Diagnosis not present

## 2023-04-20 DIAGNOSIS — R112 Nausea with vomiting, unspecified: Secondary | ICD-10-CM | POA: Diagnosis not present

## 2023-04-20 DIAGNOSIS — K5909 Other constipation: Secondary | ICD-10-CM | POA: Diagnosis not present

## 2023-04-20 LAB — CBC WITH DIFFERENTIAL/PLATELET
Basophils Absolute: 0.1 10*3/uL (ref 0.0–0.1)
Basophils Relative: 0.7 % (ref 0.0–3.0)
Eosinophils Absolute: 0 10*3/uL (ref 0.0–0.7)
Eosinophils Relative: 0.5 % (ref 0.0–5.0)
HCT: 39.4 % (ref 36.0–46.0)
Hemoglobin: 12.9 g/dL (ref 12.0–15.0)
Lymphocytes Relative: 17.6 % (ref 12.0–46.0)
Lymphs Abs: 1.6 10*3/uL (ref 0.7–4.0)
MCHC: 32.6 g/dL (ref 30.0–36.0)
MCV: 88.4 fl (ref 78.0–100.0)
Monocytes Absolute: 0.4 10*3/uL (ref 0.1–1.0)
Monocytes Relative: 4.4 % (ref 3.0–12.0)
Neutro Abs: 7.1 10*3/uL (ref 1.4–7.7)
Neutrophils Relative %: 76.8 % (ref 43.0–77.0)
Platelets: 244 10*3/uL (ref 150.0–400.0)
RBC: 4.46 Mil/uL (ref 3.87–5.11)
RDW: 14 % (ref 11.5–15.5)
WBC: 9.3 10*3/uL (ref 4.0–10.5)

## 2023-04-20 LAB — COMPREHENSIVE METABOLIC PANEL
ALT: 12 U/L (ref 0–35)
AST: 12 U/L (ref 0–37)
Albumin: 4.2 g/dL (ref 3.5–5.2)
Alkaline Phosphatase: 58 U/L (ref 39–117)
BUN: 12 mg/dL (ref 6–23)
CO2: 26 mEq/L (ref 19–32)
Calcium: 9.1 mg/dL (ref 8.4–10.5)
Chloride: 107 mEq/L (ref 96–112)
Creatinine, Ser: 0.99 mg/dL (ref 0.40–1.20)
GFR: 80.38 mL/min (ref >60.00)
Glucose, Bld: 102 mg/dL — ABNORMAL HIGH (ref 70–99)
Potassium: 3.5 mEq/L (ref 3.5–5.1)
Sodium: 139 mEq/L (ref 135–145)
Total Bilirubin: 0.4 mg/dL (ref 0.2–1.2)
Total Protein: 6.9 g/dL (ref 6.0–8.3)

## 2023-04-20 LAB — POCT URINE PREGNANCY: Preg Test, Ur: NEGATIVE

## 2023-04-20 LAB — LIPASE: Lipase: 20 U/L (ref 11.0–59.0)

## 2023-04-20 LAB — AMYLASE: Amylase: 29 U/L (ref 27–131)

## 2023-04-20 MED ORDER — ONDANSETRON HCL 4 MG PO TABS: 4.0000 mg | ORAL_TABLET | Freq: Three times a day (TID) | ORAL | 0 refills | Status: DC | PRN

## 2023-04-20 NOTE — Assessment & Plan Note (Signed)
Kub ordered, suspected constipation  However luq abdominal pain more moderate, will order amylase lipase cbc cmp  Pending results Ddx low suspicion pancreatitis Could also consider gastroenteritis

## 2023-04-20 NOTE — Assessment & Plan Note (Addendum)
Hcg in office negative Zofran 4 mg prn  Try to keep fluids going with proper hydration  Advised to monitor for dehydration symptoms

## 2023-04-20 NOTE — Progress Notes (Signed)
Established Patient Office Visit  Subjective:   Patient ID: Christina Mccall, female    DOB: 2000/10/23  Age: 22 y.o. MRN: [id number]  CC:  Chief Complaint  Patient presents with   Emesis    Vomiting, diarrhea, stomach cramps, nausea x2 weeks. Constant throughout the day but worse in the morning.  At home pregnancy test was negative last week; mis taking ocp    HPI: Christina Mccall is a 22 y.o. female presenting on 04/20/2023 for Emesis (Vomiting, diarrhea, stomach cramps, nausea x2 weeks. Constant throughout the day but worse in the morning. /At home pregnancy test was negative last week; mis taking ocp)  Last week started with vomiting and diarrhea. Also c/o nausea.  The nausea is mainly in the am, and then diarrhea.  Yesterday had diarrhea about two times. Some abdominal pain as well. She states the diarrhea is very minimal and is struggling with constipation, when she tries to poop it is just small pellets.  No fever. Last Monday is when it started.   No sore throat no ear pain no nasal congestion.  Took a pregnancy test and was negative, which was last week.  She has had her period started two days ago.   Did eat some sphagetti last night but threw up this am. Last vomited this am as well.      ROS: Negative unless specifically indicated above in HPI.   Relevant past medical history reviewed and updated as indicated.   Allergies and medications reviewed and updated.   Current Outpatient Medications:    escitalopram (LEXAPRO) 10 MG tablet, TAKE 1 TABLET BY MOUTH EVERY DAY, Disp: 90 tablet, Rfl: 1   norgestimate-ethinyl estradiol (SPRINTEC 28) 0.25-35 MG-MCG tablet, Take 1 tablet by mouth daily., Disp: 28 tablet, Rfl: 11   ondansetron (ZOFRAN) 4 MG tablet, Take 1 tablet (4 mg total) by mouth every 8 (eight) hours as needed for nausea or vomiting., Disp: 20 tablet, Rfl: 0   traZODone (DESYREL) 100 MG tablet, Take 1 tablet (100 mg total) by mouth at bedtime., Disp: 90 tablet,  Rfl: 0  Allergies  Allergen Reactions   Benadryl [Diphenhydramine] Other (See Comments)    seizures    Objective:   BP 118/68   Pulse 88   Temp (!) 97.2 F (36.2 C) (Temporal)   Ht 5' 4.5" (1.638 m)   Wt 163 lb (73.9 kg)   LMP 04/17/2023   SpO2 98%   BMI 27.55 kg/m    Physical Exam Constitutional:      General: She is not in acute distress.    Appearance: Normal appearance. She is normal weight. She is not ill-appearing, toxic-appearing or diaphoretic.  HENT:     Head: Normocephalic.     Right Ear: Tympanic membrane normal.     Left Ear: Tympanic membrane normal.     Nose: Nose normal.     Mouth/Throat:     Mouth: Mucous membranes are dry.     Pharynx: No oropharyngeal exudate or posterior oropharyngeal erythema.  Eyes:     Extraocular Movements: Extraocular movements intact.     Pupils: Pupils are equal, round, and reactive to light.  Cardiovascular:     Rate and Rhythm: Normal rate and regular rhythm.     Pulses: Normal pulses.     Heart sounds: Normal heart sounds.  Pulmonary:     Effort: Pulmonary effort is normal.     Breath sounds: Normal breath sounds.  Abdominal:     General: Bowel sounds are  increased.     Tenderness: There is abdominal tenderness in the right lower quadrant, left upper quadrant and left lower quadrant.  Musculoskeletal:     Cervical back: Normal range of motion.  Neurological:     General: No focal deficit present.     Mental Status: She is alert and oriented to person, place, and time. Mental status is at baseline.  Psychiatric:        Mood and Affect: Mood normal.        Behavior: Behavior normal.        Thought Content: Thought content normal.        Judgment: Judgment normal.     Assessment & Plan:  Nausea and vomiting, unspecified vomiting type Assessment & Plan: Hcg in office negative Zofran 4 mg prn  Try to keep fluids going with proper hydration  Advised to monitor for dehydration symptoms  Orders: -     POCT urine  pregnancy -     DG Abd 1 View; Future -     Ondansetron HCl; Take 1 tablet (4 mg total) by mouth every 8 (eight) hours as needed for nausea or vomiting.  Dispense: 20 tablet; Refill: 0  Other constipation Assessment & Plan: Worsening Kub ordered today suspect moderate constipation will await, but then likely will recommend laxative with stool softener and to increase fluid intake with water. Advised pt will send mychart message once kub results received.   Orders: -     CBC with Differential/Platelet -     Comprehensive metabolic panel -     DG Abd 1 View; Future  Generalized abdominal pain Assessment & Plan: Kub ordered, suspected constipation  However luq abdominal pain more moderate, will order amylase lipase cbc cmp  Pending results Ddx low suspicion pancreatitis Could also consider gastroenteritis  Orders: -     CBC with Differential/Platelet -     Comprehensive metabolic panel -     Amylase -     Lipase -     DG Abd 1 View; Future     Follow up plan: Return for f/u PCP if no improvement in symptoms.  Mort Sawyers, FNP

## 2023-04-20 NOTE — Assessment & Plan Note (Signed)
Worsening Kub ordered today suspect moderate constipation will await, but then likely will recommend laxative with stool softener and to increase fluid intake with water. Advised pt will send mychart message once kub results received.

## 2023-04-21 ENCOUNTER — Ambulatory Visit: Payer: BC Managed Care – PPO | Admitting: Nurse Practitioner

## 2023-06-20 ENCOUNTER — Telehealth: Payer: Self-pay | Admitting: Family Medicine

## 2023-06-20 NOTE — Telephone Encounter (Signed)
Christina Mccall, CMA  Joaquim Nam, MD [name],  After several months of trying to reach this patient she has finally responded and has requested this referral be canceled - she no longer wants it.  FYI

## 2023-06-21 ENCOUNTER — Telehealth: Payer: BC Managed Care – PPO | Admitting: Child and Adolescent Psychiatry

## 2023-07-12 ENCOUNTER — Other Ambulatory Visit (HOSPITAL_COMMUNITY): Payer: Self-pay | Admitting: Psychiatry

## 2023-07-12 DIAGNOSIS — F3341 Major depressive disorder, recurrent, in partial remission: Secondary | ICD-10-CM

## 2023-07-12 MED ORDER — TRAZODONE HCL 100 MG PO TABS: 100.0000 mg | ORAL_TABLET | Freq: Every day | ORAL | 0 refills | Status: DC

## 2023-07-19 ENCOUNTER — Telehealth (INDEPENDENT_AMBULATORY_CARE_PROVIDER_SITE_OTHER): Payer: BC Managed Care – PPO | Admitting: Child and Adolescent Psychiatry

## 2023-07-19 DIAGNOSIS — F330 Major depressive disorder, recurrent, mild: Secondary | ICD-10-CM

## 2023-07-19 DIAGNOSIS — F411 Generalized anxiety disorder: Secondary | ICD-10-CM

## 2023-07-19 MED ORDER — ESCITALOPRAM OXALATE 10 MG PO TABS: 10.0000 mg | ORAL_TABLET | Freq: Every day | ORAL | 1 refills | Status: DC

## 2023-07-19 NOTE — Progress Notes (Signed)
Virtual Visit via Video Note  I connected with Christina Mccall on 07/19/23 at 11:00 AM EDT by a video enabled telemedicine application and verified that I am speaking with the correct person using two identifiers.  Location: Patient: home Provider: office   I discussed the limitations of evaluation and management by telemedicine and the availability of in person appointments. The patient expressed understanding and agreed to proceed.   I discussed the assessment and treatment plan with the patient. The patient was provided an opportunity to ask questions and all were answered. The patient agreed with the plan and demonstrated an understanding of the instructions.   The patient was advised to call back or seek an in-person evaluation if the symptoms worsen or if the condition fails to improve as anticipated.    Darcel Smalling, MD   [facility] MD/PA/NP OP Progress Note  07/19/2023 11:52 AM Christina Mccall  MRN:  [id number]  Chief Complaint:  Medication management follow-up for mood and anxiety.   Synopsis: This is a 22 year old Caucasian female with medical history significant of left hemiparesis since 2002, generalized epilepsy and psychiatric history significant of mood disorder, generalized anxiety disorder and ADHD has been under outpatient psychiatric care at [facility] since 2015, and med trials include Effexor XR, Lamictal, Abilify, Topamax in the past, currently on Zoloft and Trazodone.   HPI:   Christina Mccall was seen and evaluated over telemedicine encounter for medication management follow-up.  She presented for her appointment after about 5 months.  She reported that she has been doing well overall however intermittently has feelings of worthlessness in the context of situational stressors.  She reported that this occurs when she has arguments with her boyfriend, and sometimes when she old thinks that she is not good enough.  She reported that she tells herself that she is good enough and  everything will be okay and that helps her.  She reported that this feelings last briefly and other times her mood ranges between 5-10, 10 being the best mood.  She also reported that her anxiety is around 5 out of 10, 10 being most anxious.  She reported that now she receives Tree surgeon, she occasionally works at [employer] about 2 days a month, and in her free time she is hanging out with her sister, her mother, her boyfriend.  She however expressed her frustration regarding not being able to drive and go more outside of the home.  We discussed if she can find out whether she can drive or not, she reported that her previous neurologist said that she cannot drive.  We discussed that she can reach out to driving education school and they can help with this.  She was receptive to this.  She denied any SI or HI, reported that she has been taking her medications as prescribed, would like to start seeing a therapist.  We discussed the fact we will make a referral to see if patient can start seeing a therapist here.  She verbalized understanding.  We discussed that if she does not hear back from this office, she should look on [email or web address] to find therapist for herself.  She verbalized understanding.  She will follow-up again in about 2 months or earlier if needed.  Visit Diagnosis:    ICD-10-CM   1. Generalized anxiety disorder  F41.1     2. Mild episode of recurrent major depressive disorder (HCC)  F33.0       Past Psychiatric History: As mentioned in initial H&P, reviewed today,  as following Patient has never been hospitalized psychiatrically, does not have any history of suicide, was previously seeing Dr. Lucianne Muss and then Dr. Karie Schwalbe at the [facility] outpatient clinic before transitioning to Dr. Daleen Bo and subsequently to this writer.  She is currently not in any psychotherapy.   Past Medical History:  Past Medical History:  Diagnosis Date   Anxiety    Cerebral infarction involving right cerebellar  artery (HCC)    intrauterine   CP (cerebral palsy) (HCC)    Epilepsy (HCC)    Headache(784.0)    Left hemiparesis (HCC)    Seizures (HCC)     Past Surgical History:  Procedure Laterality Date   NO PAST SURGERIES      Family Psychiatric History: Reviewed today and as following Paternal Uncle - Depression; Mother - Anxiety, reviewed today and no change.  Family History:  Family History  Problem Relation Age of Onset   Migraines Maternal Grandfather    Alcohol abuse Mother    Anxiety disorder Father    Alcohol abuse Father     Social History:  Social History   Socioeconomic History   Marital status: Single    Spouse name: Not on file   Number of children: 0   Years of education: Not on file   Highest education level: Not on file  Occupational History   Not on file  Tobacco Use   Smoking status: Never    Passive exposure: Never   Smokeless tobacco: Never  Vaping Use   Vaping status: Never Used  Substance and Sexual Activity   Alcohol use: No   Drug use: No   Sexual activity: Never  Other Topics Concern   Not on file  Social History Narrative   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 She lives with her parents and siblings in separate households. She enjoys swimming, being with her family and eating ice cream.       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       Hobbies: hanging out with the family   Live with father and step mother and step brother (younger)   Social Determinants of Corporate investment banker Strain: Not on file  Food Insecurity: Not on file  Transportation Needs: Not on file  Physical Activity: Not on file  Stress: Not on file  Social Connections: Not on file    Allergies:  Allergies  Allergen Reactions   Benadryl [Diphenhydramine] Other (See Comments)    seizures    Metabolic Disorder Labs: No results found for: "HGBA1C", "MPG" No results found for:  "PROLACTIN" Lab Results  Component Value Date   CHOL 159 09/23/2022   TRIG 82.0 09/23/2022   HDL 45.90 09/23/2022   CHOLHDL 3 09/23/2022   VLDL 16.4 09/23/2022   LDLCALC 97 09/23/2022   Lab Results  Component Value Date   TSH 3.60 09/23/2022   TSH 1.970 03/11/2018    Therapeutic Level Labs: No results found for: "LITHIUM" No results found for: "VALPROATE" No results found for: "CBMZ"  Current Medications: Current Outpatient Medications  Medication Sig Dispense Refill   escitalopram (LEXAPRO) 10 MG tablet Take 1 tablet (10 mg total) by mouth daily. 90 tablet 1   norgestimate-ethinyl estradiol (SPRINTEC 28) 0.25-35 MG-MCG tablet Take 1 tablet by mouth daily. 28 tablet 11   ondansetron (ZOFRAN) 4 MG tablet Take 1 tablet (4 mg total) by mouth every 8 (eight) hours  as needed for nausea or vomiting. 20 tablet 0   traZODone (DESYREL) 100 MG tablet Take 1 tablet (100 mg total) by mouth at bedtime. 90 tablet 0   No current facility-administered medications for this visit.     Musculoskeletal:  Gait & Station:unable to assess since visit was over the telemedicine. Patient leans: unable to assess since visit was over the telemedicine.  Psychiatric Specialty Exam: ROSReview of 12 systems negative except as mentioned in HPI  There were no vitals taken for this visit.There is no height or weight on file to calculate BMI.  General Appearance: Casual  Eye Contact:  Fair  Speech:  Clear and Coherent and Normal Rate  Volume:  Normal  Mood:   "good..."  Affect:  Appropriate, Congruent, and Restricted  Thought Process:  Goal Directed and Linear  Orientation:  Full (Time, Place, and Person)  Thought Content: Logical   Suicidal Thoughts:  No  Homicidal Thoughts:  No  Memory:  Immediate;   Fair Recent;   Fair Remote;   Fair  Judgement:  Fair  Insight:  Fair  Psychomotor Activity:  Normal  Concentration:  Concentration: Fair and Attention Span: Fair  Recall:  [employer] of  Knowledge: Fair  Language: Fair  Akathisia:  NA  Handed:  Right  AIMS (if indicated): not done  Assets:  Desire for Improvement Financial Resources/Insurance Housing Leisure Time Social Support  ADL's:  Intact  Cognition: WNL  Sleep:   Fair   Screenings: GAD-7    Garment/textile technologist Visit from 04/15/2022 in [facility]  Total GAD-7 Score 7      PHQ2-9    Flowsheet Row Office Visit from 09/07/2022 in [facility] Office Visit from 09/03/2022 in [facility] Office Visit from 06/12/2022 in [facility] Video Visit from 05/12/2022 in [facility] Office Visit from 05/08/2022 in [facility]  PHQ-2 Total Score 3 2 1 2 4   PHQ-9 Total Score 8 9 5 6 9       Flowsheet Row Video Visit from 05/12/2022 in [facility] Office Visit from 04/15/2022 in [facility]  C-SSRS RISK CATEGORY Error: Q3, 4, or 5 should not be populated when Q2 is No High Risk        Assessment and Plan:   Plan reviewed on 07/19/23.  Reviewed response to her current medications, appears to have some situational worsening of mood and anxiety however overall appears to have stability with her symptoms.  Recommending to continue with current medications, recommend individual therapy, we will send a referral to the clinic here to see if she can see her therapist here or else she is advised to search for therapist on [email or web address]   Plan as mentioned below.    # Depression - recurrent and in remission - continue with Lexapro 10 mg daily  # Generalized Anxiety disorder- Chronic and stable - As mentioned above for depression  # ADHD  - Improved  # Insomnia - Chronic and stable - Continue Trazodone 100mg  QHS  - Return to clinic in 8-10 weeks or early if needed.    MDM = 2 or more chronic conditions + med management  Darcel Smalling, MD 07/19/2023, 11:52 AM

## 2023-08-19 ENCOUNTER — Encounter: Payer: Self-pay | Admitting: Nurse Practitioner

## 2023-08-19 ENCOUNTER — Ambulatory Visit: Payer: BC Managed Care – PPO | Admitting: Nurse Practitioner

## 2023-08-19 VITALS — BP 116/66 | HR 77 | Temp 97.7°F | Ht 64.5 in | Wt 159.6 lb

## 2023-08-19 DIAGNOSIS — Z3042 Encounter for surveillance of injectable contraceptive: Secondary | ICD-10-CM

## 2023-08-19 LAB — POCT URINE PREGNANCY: Preg Test, Ur: NEGATIVE

## 2023-08-19 MED ORDER — MEDROXYPROGESTERONE ACETATE 150 MG/ML IM SUSP
150.0000 mg | INTRAMUSCULAR | Status: DC
  Administered 2023-08-19 – 2023-11-16 (×2): 150 mg via INTRAMUSCULAR

## 2023-08-19 NOTE — Assessment & Plan Note (Signed)
Patient was on Depo-Provera in the past.  Will switch to OCP and had breakthrough bleeding will switch to second OCP not having breakthrough bleeding.  Patient like to go back on Depo-Provera.  Urine pregnancy test negative in office.  Patient to continue OCP for 7 days after injection and use backup form of birth control for 7 to 10 days.  Depo-Provera 150 mg IM given today

## 2023-08-19 NOTE — Progress Notes (Signed)
Established Patient Office Visit  Subjective   Patient ID: Christina Mccall, female    DOB: 10-14-2000  Age: 22 y.o. MRN: [id number]  Chief Complaint  Patient presents with   Contraception    Pt would like to discuss switching to depo shot from the [facility] pills. States that she has on and off consistent bleeding.  Pt states that she was was on the depo shot in [employer].     HPI  Birth control: states that she is on OCP. States that she has been on it for approx . States that she has been dealing with periods of bleeding and then she will stop bleeding. She was on depo preva in the past. State that she will bleed for a month then stop for a month and repeat. No pain   Dx with cerval palsy Epilepsy patient used to be on antiepileptic medication but no longer.  She is no longer followed by neurology  Headaches without aura.  No diagnosis of migraine with aura patient has been on the Depo-Provera shot and OCP in the past  Breast cancer: maternal gramother. Maternal great grandmother. Maternal great aunt. Second cousin Gallbaldder dysfuction: no dx of the same    Review of Systems  Constitutional:  Negative for chills and fever.  Respiratory:  Negative for shortness of breath.   Cardiovascular:  Negative for chest pain.  Genitourinary:        "-" vaginal discharge   Neurological:  Negative for headaches.  Psychiatric/Behavioral:  Negative for hallucinations and suicidal ideas.       Objective:     BP 116/66   Pulse 77   Temp 97.7 F (36.5 C) (Oral)   Ht 5' 4.5" (1.638 m)   Wt 159 lb 9.6 oz (72.4 kg)   LMP 08/16/2023 (Approximate) Comment: couple days ago.  SpO2 95%   BMI 26.97 kg/m    Physical Exam Vitals and nursing note reviewed.  Constitutional:      Appearance: Normal appearance.  Cardiovascular:     Rate and Rhythm: Normal rate and regular rhythm.     Heart sounds: Normal heart sounds.  Pulmonary:     Effort: Pulmonary effort is normal.     Breath sounds:  Normal breath sounds.  Neurological:     Mental Status: She is alert.      Results for orders placed or performed in visit on 08/19/23  POCT urine pregnancy  Result Value Ref Range   Preg Test, Ur Negative Negative      The ASCVD Risk score (Arnett DK, et al., 2019) failed to calculate for the following reasons:   The 2019 ASCVD risk score is only valid for ages 22 to 42   The patient has a prior MI or stroke diagnosis    Assessment & Plan:   Problem List Items Addressed This Visit       Other   Encounter for surveillance of injectable contraceptive - Primary    Patient was on Depo-Provera in the past.  Will switch to OCP and had breakthrough bleeding will switch to second OCP not having breakthrough bleeding.  Patient like to go back on Depo-Provera.  Urine pregnancy test negative in office.  Patient to continue OCP for 7 days after injection and use backup form of birth control for 7 to 10 days.  Depo-Provera 150 mg IM given today      Relevant Medications   medroxyPROGESTERone (DEPO-PROVERA) injection 150 mg (Start on 08/19/2023  3:15 PM)  Other Relevant Orders   POCT urine pregnancy (Completed)    Return in about 3 months (around 11/13/2023) for CPE and Labs, depo injeciton .    Audria Nine, NP

## 2023-08-19 NOTE — Patient Instructions (Addendum)
NIce to see you today. We will start you back on the injectable birth control to help control the bleeding.  I will need to see you between Feb 6-20 for your physical and Depo injection   Continue taking the pill birth control pills for 7 days and use a back of form contraception (like a condom)

## 2023-09-06 ENCOUNTER — Emergency Department: Payer: MEDICAID

## 2023-09-06 ENCOUNTER — Emergency Department
Admission: EM | Admit: 2023-09-06 | Discharge: 2023-09-06 | Disposition: A | Discharge status: Home / Self Care | Payer: MEDICAID | Attending: Emergency Medicine | Admitting: Emergency Medicine

## 2023-09-06 ENCOUNTER — Other Ambulatory Visit: Payer: Self-pay

## 2023-09-06 DIAGNOSIS — S93492A Sprain of other ligament of left ankle, initial encounter: Secondary | ICD-10-CM | POA: Diagnosis not present

## 2023-09-06 DIAGNOSIS — X501XXA Overexertion from prolonged static or awkward postures, initial encounter: Secondary | ICD-10-CM | POA: Insufficient documentation

## 2023-09-06 DIAGNOSIS — M25572 Pain in left ankle and joints of left foot: Secondary | ICD-10-CM | POA: Diagnosis not present

## 2023-09-06 MED ORDER — NAPROXEN 500 MG PO TABS
500.0000 mg | ORAL_TABLET | Freq: Once | ORAL | Status: AC
  Administered 2023-09-06: 500 mg via ORAL
  Filled 2023-09-06: qty 1

## 2023-09-06 MED ORDER — NAPROXEN 500 MG PO TABS: 500.0000 mg | ORAL_TABLET | Freq: Two times a day (BID) | ORAL | 2 refills | Status: DC

## 2023-09-06 NOTE — ED Triage Notes (Signed)
Pt comes with c/o left ankle pain after trip and fall. Pt states no loc or hitting head.

## 2023-09-06 NOTE — ED Notes (Addendum)
See triage note, pt A&Ox4 at this time. NAD noted.   Pt states she feel down a few stairs about 6 and also endorses CP to L ankle which causes some weakness which states made her leg give out.   Pt does have obvious deformity, swelling and bruising noted to L ankle. Pedal pulse intact.

## 2023-09-06 NOTE — ED Provider Notes (Signed)
   [facility] Provider Note    Event Date/Time   First MD Initiated Contact with Patient 09/06/23 [phone]     (approximate)   History   Ankle Pain   HPI  Christina Mccall is a 22 y.o. female who presents after twisting her ankle yesterday, on the left.  She reports increased swelling and pain today.     Physical Exam   Triage Vital Signs: ED Triage Vitals  Encounter Vitals Group     BP 09/06/23 0917 114/75     Systolic BP Percentile --      Diastolic BP Percentile --      Pulse Rate 09/06/23 0917 (!) 103     Resp 09/06/23 0917 17     Temp 09/06/23 0917 98 F (36.7 C)     Temp src --      SpO2 09/06/23 0917 95 %     Weight 09/06/23 0918 72.4 kg (159 lb 9.6 oz)     Height 09/06/23 0918 1.638 m (5' 4.5")     Head Circumference --      Peak Flow --      Pain Score 09/06/23 0918 10     Pain Loc --      Pain Education --      Exclude from Growth Chart --     Most recent vital signs: Vitals:   09/06/23 0917  BP: 114/75  Pulse: (!) 103  Resp: 17  Temp: 98 F (36.7 C)  SpO2: 95%     General: Awake, no distress.  CV:  Good peripheral perfusion.  Resp:  Normal effort.  Abd:  No distention.  Other:  Left ankle: Swelling and bruising along the left lateral malleolus, no bony abnormalities palpated, warm and well-perfused   ED Results / Procedures / Treatments   Labs (all labs ordered are listed, but only abnormal results are displayed) Labs Reviewed - No data to display   EKG     RADIOLOGY Ankle x-ray viewed interpret by me, no evidence of fracture, confirmed by radiology    PROCEDURES:  Critical Care performed:   Procedures   MEDICATIONS ORDERED IN ED: Medications  naproxen (NAPROSYN) tablet 500 mg (500 mg Oral Given 09/06/23 1013)     IMPRESSION / MDM / ASSESSMENT AND PLAN / ED COURSE  I reviewed the triage vital signs and the nursing notes. Patient's presentation is most consistent with acute complicated illness /  injury requiring diagnostic workup.  Patient presents with ankle injury as above, differential include sprain versus fracture, suspect sprain, confirmed by x-ray.  Splint applied by tech, analgesics, recommend RICE, outpatient follow-up with Ortho as needed.        FINAL CLINICAL IMPRESSION(S) / ED DIAGNOSES   Final diagnoses:  Sprain of anterior talofibular ligament of left ankle, initial encounter     Rx / DC Orders   ED Discharge Orders          Ordered    naproxen (NAPROSYN) 500 MG tablet  2 times daily with meals        09/06/23 1032             Note:  This document was prepared using Dragon voice recognition software and may include unintentional dictation errors.   Jene Every, MD 09/06/23 1220

## 2023-09-16 ENCOUNTER — Telehealth (INDEPENDENT_AMBULATORY_CARE_PROVIDER_SITE_OTHER): Payer: MEDICAID | Admitting: Child and Adolescent Psychiatry

## 2023-09-16 DIAGNOSIS — F3341 Major depressive disorder, recurrent, in partial remission: Secondary | ICD-10-CM

## 2023-09-16 MED ORDER — TRAZODONE HCL 100 MG PO TABS: 100.0000 mg | ORAL_TABLET | Freq: Every day | ORAL | 0 refills | Status: DC

## 2023-09-16 NOTE — Progress Notes (Signed)
Virtual Visit via Video Note  I connected with Christina Mccall on 09/16/23 at  1:30 PM EST by a video enabled telemedicine application and verified that I am speaking with the correct person using two identifiers.  Location: Patient: home Provider: office   I discussed the limitations of evaluation and management by telemedicine and the availability of in person appointments. The patient expressed understanding and agreed to proceed.   I discussed the assessment and treatment plan with the patient. The patient was provided an opportunity to ask questions and all were answered. The patient agreed with the plan and demonstrated an understanding of the instructions.   The patient was advised to call back or seek an in-person evaluation if the symptoms worsen or if the condition fails to improve as anticipated.    Christina Smalling, MD   [facility] MD/PA/NP OP Progress Note  09/16/2023 1:58 PM Christina Mccall  MRN:  [id number]  Chief Complaint:  Medication management follow-up for mood and anxiety.   Synopsis: This is a 22 year old Caucasian female with medical history significant of left hemiparesis since 2002, generalized epilepsy and psychiatric history significant of mood disorder, generalized anxiety disorder and ADHD has been under outpatient psychiatric care at [facility] since 2015, and med trials include Effexor XR, Lamictal, Abilify, Topamax in the past, currently on Zoloft and Trazodone.   HPI:   Christina Mccall was seen and evaluated over telemedicine encounter for medication management follow-up.  She presented for her appointment alone.  She reported that in the interim since last appointment at least since last 1 month she has discontinued taking Lexapro because she has been feeling well.  She reported that her mood has been around 8-9 out of 10, 10 being the best mood and reported that she has been able to go to her family members and that has been helping her with her mood.  She reported that  despite psychosocial stressors recently this month of her great grandfather's death and falling down the staircase, she has been able to manage her mood and anxiety well, she rated her anxiety around 5 out of 10, 10 being most anxious.  She reported that she had ligament injury but she is recovering well from fall.  Denied any other medical problems.  She denied any SI or HI and reported that she continues to take trazodone to help with the sleep.  Discussed risk of relapse associated with recurrent depression without the treatment she verbalized understanding and would like to not restart taking Lexapro and will reach out if her symptoms come back or worsen.  Visit Diagnosis:    ICD-10-CM   1. Recurrent major depressive disorder, in partial remission (HCC)  F33.41 traZODone (DESYREL) 100 MG tablet       Past Psychiatric History: As mentioned in initial H&P, reviewed today, as following Patient has never been hospitalized psychiatrically, does not have any history of suicide, was previously seeing Dr. Lucianne Muss and then Dr. Karie Schwalbe at the [facility] outpatient clinic before transitioning to Dr. Daleen Bo and subsequently to this writer.  She is currently not in any psychotherapy.   Past Medical History:  Past Medical History:  Diagnosis Date   Anxiety    Cerebral infarction involving right cerebellar artery (HCC)    intrauterine   CP (cerebral palsy) (HCC)    Epilepsy (HCC)    Headache(784.0)    Left hemiparesis (HCC)    Seizures (HCC)     Past Surgical History:  Procedure Laterality Date   NO PAST SURGERIES  Family Psychiatric History: Reviewed today and as following Paternal Uncle - Depression; Mother - Anxiety, reviewed today and no change.  Family History:  Family History  Problem Relation Age of Onset   Migraines Maternal Grandfather    Alcohol abuse Mother    Anxiety disorder Father    Alcohol abuse Father     Social History:  Social History   Socioeconomic History   Marital  status: Single    Spouse name: Not on file   Number of children: 0   Years of education: Not on file   Highest education level: Not on file  Occupational History   Not on file  Tobacco Use   Smoking status: Never    Passive exposure: Never   Smokeless tobacco: Never  Vaping Use   Vaping status: Never Used  Substance and Sexual Activity   Alcohol use: No   Drug use: No   Sexual activity: Never  Other Topics Concern   Not on file  Social History Narrative   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 She lives with her parents and siblings in separate households. She enjoys swimming, being with her family and eating ice cream.       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       Hobbies: hanging out with the family   Live with father and step mother and step brother (younger)   Social Drivers of Corporate investment banker Strain: Not on file  Food Insecurity: Not on file  Transportation Needs: Not on file  Physical Activity: Not on file  Stress: Not on file  Social Connections: Not on file    Allergies:  Allergies  Allergen Reactions   Benadryl [Diphenhydramine] Other (See Comments)    seizures    Metabolic Disorder Labs: No results found for: "HGBA1C", "MPG" No results found for: "PROLACTIN" Lab Results  Component Value Date   CHOL 159 09/23/2022   TRIG 82.0 09/23/2022   HDL 45.90 09/23/2022   CHOLHDL 3 09/23/2022   VLDL 16.4 09/23/2022   LDLCALC 97 09/23/2022   Lab Results  Component Value Date   TSH 3.60 09/23/2022   TSH 1.970 03/11/2018    Therapeutic Level Labs: No results found for: "LITHIUM" No results found for: "VALPROATE" No results found for: "CBMZ"  Current Medications: Current Outpatient Medications  Medication Sig Dispense Refill   escitalopram (LEXAPRO) 10 MG tablet Take 1 tablet (10 mg total) by mouth daily. 90 tablet 1   naproxen (NAPROSYN) 500 MG tablet Take 1  tablet (500 mg total) by mouth 2 (two) times daily with a meal. 20 tablet 2   traZODone (DESYREL) 100 MG tablet Take 1 tablet (100 mg total) by mouth at bedtime. 90 tablet 0   Current Facility-Administered Medications  Medication Dose Route Frequency Provider Last Rate Last Admin   medroxyPROGESTERone (DEPO-PROVERA) injection 150 mg  150 mg Intramuscular Q90 days    150 mg at 08/19/23 1620     Musculoskeletal:  Gait & Station:unable to assess since visit was over the telemedicine. Patient leans: unable to assess since visit was over the telemedicine.  Psychiatric Specialty Exam: ROSReview of 12 systems negative except as mentioned in HPI  Last menstrual period 08/16/2023.There is no height or weight on file to calculate BMI.  General Appearance: Casual  Eye Contact:  Fair  Speech:  Clear and Coherent and Normal Rate  Volume:  Normal  Mood:   "good..."  Affect:  Appropriate, Congruent, and Restricted  Thought Process:  Goal Directed and Linear  Orientation:  Full (Time, Place, and Person)  Thought Content: Logical   Suicidal Thoughts:  No  Homicidal Thoughts:  No  Memory:  Immediate;   Fair Recent;   Fair Remote;   Fair  Judgement:  Fair  Insight:  Fair  Psychomotor Activity:  Normal  Concentration:  Concentration: Fair and Attention Span: Fair  Recall:  [employer] of Knowledge: Fair  Language: Fair  Akathisia:  NA  Handed:  Right  AIMS (if indicated): not done  Assets:  Desire for Improvement Financial Resources/Insurance Housing Leisure Time Social Support  ADL's:  Intact  Cognition: WNL  Sleep:   Fair   Screenings: GAD-7    Garment/textile technologist Visit from 08/19/2023 in [facility] [place] [facility] Visit from 04/15/2022 in [facility]  Total GAD-7 Score 11 7      PHQ2-9    Flowsheet Row Office Visit from 08/19/2023 in [facility] Office Visit from  09/07/2022 in [facility] [place] Office Visit from 09/03/2022 in [facility] Of [place]. [facility] Office Visit from 06/12/2022 in [facility] Of [place]. [facility] Video Visit from 05/12/2022 in [facility]  PHQ-2 Total Score 2 3 2 1 2   PHQ-9 Total Score 11 8 9 5 6       Flowsheet Row ED from 09/06/2023 in [facility] Department at [facility] Video Visit from 05/12/2022 in [facility] Office Visit from 04/15/2022 in [facility]  C-SSRS RISK CATEGORY No Risk Error: Q3, 4, or 5 should not be populated when Q2 is No High Risk        Assessment and Plan:   Plan reviewed on 09/16/23.  Reviewed response to her current medications and appears to have overall stability with mood and anxiety despite not taking Lexapro and psychosocial stressors.  Discussed risk of relapse with not being on medication, she prefers to not restart Lexapro.  Will continue to monitor and she will follow-up again in about 2 months or earlier if needed.  Plan as mentioned below.    # Depression - recurrent and in remission -Self-discontinued Lexapro 10 mg daily.  # Generalized Anxiety disorder- Chronic and stable - As mentioned above for depression  # ADHD  - Improved  # Insomnia - Chronic and stable - Continue Trazodone 100mg  QHS  - Return to clinic in 8-10 weeks or early if needed.   MDM = 2 or more chronic conditions + med management  Christina Smalling, MD 09/16/2023, 1:58 PM

## 2023-10-28 ENCOUNTER — Telehealth: Payer: Self-pay | Admitting: Nurse Practitioner

## 2023-10-28 NOTE — Telephone Encounter (Signed)
Copied from CRM [id number]. Topic: Appointments - Appointment Scheduling >> Oct 28, 2023  9:50 AM Christina Mccall wrote: Patient is calling to schedule her DEPO shot, please call her back at [phone]

## 2023-11-15 ENCOUNTER — Telehealth (INDEPENDENT_AMBULATORY_CARE_PROVIDER_SITE_OTHER): Payer: Medicaid Other | Admitting: Child and Adolescent Psychiatry

## 2023-11-15 DIAGNOSIS — F411 Generalized anxiety disorder: Secondary | ICD-10-CM

## 2023-11-15 DIAGNOSIS — F3341 Major depressive disorder, recurrent, in partial remission: Secondary | ICD-10-CM

## 2023-11-15 NOTE — Progress Notes (Signed)
 Virtual Visit via Video Note  I connected with Christina Mccall on 11/15/23 at  1:30 PM EST by a video enabled telemedicine application and verified that I am speaking with the correct person using two identifiers.  Location: Patient: home Provider: office   I discussed the limitations of evaluation and management by telemedicine and the availability of in person appointments. The patient expressed understanding and agreed to proceed.   I discussed the assessment and treatment plan with the patient. The patient was provided an opportunity to ask questions and all were answered. The patient agreed with the plan and demonstrated an understanding of the instructions.   The patient was advised to call back or seek an in-person evaluation if the symptoms worsen or if the condition fails to improve as anticipated.    Darcel Smalling, MD   [facility] MD/PA/NP OP Progress Note  11/15/2023 2:56 PM Christina Mccall  MRN:  [id number]  Chief Complaint:  Medication management follow-up for mood and anxiety.   Synopsis: This is a 23 year old Caucasian female with medical history significant of left hemiparesis since 2002, generalized epilepsy and psychiatric history significant of mood disorder, generalized anxiety disorder and ADHD has been under outpatient psychiatric care at [facility] since 2015, and med trials include Effexor XR, Lamictal, Abilify, Topamax in the past, currently on Zoloft and Trazodone.   HPI:   Christina Mccall was seen and evaluated over telemedicine encounter for medication management follow-up.  She was presented for her appointment alone.  She reported that she continues to not take Lexapro, and despite that she continues to do well.  She reported that her mood has been around 8 out of 10, 10 being the best mood and her anxiety stays around 5 out of 10, 10 being most anxious when she gets anxious.  She reported that she sleeps well on trazodone, denied any problems with appetite, denied problems  with energy and denied any SI or HI.  On GAD-7 she scored total of 6 and on PHQ-9 she scored total of 9.  She prefers to continue to not take any medications at this time except trazodone.  She reported that she has been spending time with her mom and her grandmother, and choosing out with them.  She also reported that things are going well at her dad's home.  She still continues to work part-time but only minimal hours.  We discussed to continue with trazodone and follow-up again in about 3 months or earlier if needed.  She verbalized understanding and agreed with this plan.  Visit Diagnosis:    ICD-10-CM   1. Generalized anxiety disorder  F41.1     2. Recurrent major depressive disorder, in partial remission (HCC)  F33.41         Past Psychiatric History: As mentioned in initial H&P, reviewed today, as following Patient has never been hospitalized psychiatrically, does not have any history of suicide, was previously seeing Dr. Lucianne Muss and then Dr. Karie Schwalbe at the [facility] outpatient clinic before transitioning to Dr. Daleen Bo and subsequently to this writer.  She is currently not in any psychotherapy.   Past Medical History:  Past Medical History:  Diagnosis Date   Anxiety    Cerebral infarction involving right cerebellar artery (HCC)    intrauterine   CP (cerebral palsy) (HCC)    Epilepsy (HCC)    Headache(784.0)    Left hemiparesis (HCC)    Seizures (HCC)     Past Surgical History:  Procedure Laterality Date   NO PAST SURGERIES  Family Psychiatric History: Reviewed today and as following Paternal Uncle - Depression; Mother - Anxiety, reviewed today and no change.  Family History:  Family History  Problem Relation Age of Onset   Migraines Maternal Grandfather    Alcohol abuse Mother    Anxiety disorder Father    Alcohol abuse Father     Social History:  Social History   Socioeconomic History   Marital status: Single    Spouse name: Not on file   Number of children: 0    Years of education: Not on file   Highest education level: Not on file  Occupational History   Not on file  Tobacco Use   Smoking status: Never    Passive exposure: Never   Smokeless tobacco: Never  Vaping Use   Vaping status: Never Used  Substance and Sexual Activity   Alcohol use: No   Drug use: No   Sexual activity: Never  Other Topics Concern   Not on file  Social History Narrative   Mckaylin is a 9th grade student at [employer]; she does well in school. She lives with her parents and siblings in separate households. She enjoys swimming, being with her family and eating ice cream.       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       Hobbies: hanging out with the family   Live with father and step mother and step brother (younger)   Social Drivers of Corporate investment banker Strain: Not on file  Food Insecurity: Not on file  Transportation Needs: Not on file  Physical Activity: Not on file  Stress: Not on file  Social Connections: Not on file    Allergies:  Allergies  Allergen Reactions   Benadryl [Diphenhydramine] Other (See Comments)    seizures    Metabolic Disorder Labs: No results found for: "HGBA1C", "MPG" No results found for: "PROLACTIN" Lab Results  Component Value Date   CHOL 159 09/23/2022   TRIG 82.0 09/23/2022   HDL 45.90 09/23/2022   CHOLHDL 3 09/23/2022   VLDL 16.4 09/23/2022   LDLCALC 97 09/23/2022   Lab Results  Component Value Date   TSH 3.60 09/23/2022   TSH 1.970 03/11/2018    Therapeutic Level Labs: No results found for: "LITHIUM" No results found for: "VALPROATE" No results found for: "CBMZ"  Current Medications: Current Outpatient Medications  Medication Sig Dispense Refill   naproxen (NAPROSYN) 500 MG tablet Take 1 tablet (500 mg total) by mouth 2 (two) times daily with a meal. 20 tablet 2   traZODone (DESYREL) 100 MG tablet Take 1 tablet (100 mg total) by mouth at bedtime. 90  tablet 0   Current Facility-Administered Medications  Medication Dose Route Frequency Provider Last Rate Last Admin   medroxyPROGESTERone (DEPO-PROVERA) injection 150 mg  150 mg Intramuscular Q90 days    150 mg at 08/19/23 1620     Musculoskeletal:  Gait & Station:unable to assess since visit was over the telemedicine. Patient leans: unable to assess since visit was over the telemedicine.  Psychiatric Specialty Exam: ROSReview of 12 systems negative except as mentioned in HPI  There were no vitals taken for this visit.There is no height or weight on file to calculate BMI.  General Appearance: Casual  Eye Contact:  Fair  Speech:  Clear and Coherent and Normal Rate  Volume:  Normal  Mood:   "good..."  Affect:  Appropriate, Congruent, and Restricted  Thought Process:  Goal Directed and Linear  Orientation:  Full (Time, Place, and Person)  Thought Content: Logical   Suicidal Thoughts:  No  Homicidal Thoughts:  No  Memory:  Immediate;   Fair Recent;   Fair Remote;   Fair  Judgement:  Fair  Insight:  Fair  Psychomotor Activity:  Normal  Concentration:  Concentration: Fair and Attention Span: Fair  Recall:  [employer] of Knowledge: Fair  Language: Fair  Akathisia:  NA  Handed:  Right  AIMS (if indicated): not done  Assets:  Desire for Improvement Financial Resources/Insurance Housing Leisure Time Social Support  ADL's:  Intact  Cognition: WNL  Sleep:   Fair   Screenings: GAD-7    Flowsheet Row Video Visit from 11/15/2023 in [facility] Office Visit from 08/19/2023 in [facility] [place] [facility] [place] Office Visit from 04/15/2022 in [facility]  Total GAD-7 Score 6 11 7       PHQ2-9    Flowsheet Row Video Visit from 11/15/2023 in [facility] Office Visit from 08/19/2023 in [facility] [place] Office Visit  from 09/07/2022 in [facility] [place]. [facility] Office Visit from 09/03/2022 in [facility] [place]. [facility] Office Visit from 06/12/2022 in [facility] Of . [facility]  PHQ-2 Total Score 1 2 3 2 1   PHQ-9 Total Score 9 11 8 9 5       Flowsheet Row ED from 09/06/2023 in [facility] at [facility] Video Visit from 05/12/2022 in [facility] Office Visit from 04/15/2022 in [facility]  C-SSRS RISK CATEGORY No Risk Error: Q3, 4, or 5 should not be populated when Q2 is No High Risk        Assessment and Plan:   Plan reviewed on 11/15/23.  Reviewed response to her current medications and she appears to have overall stability with her anxiety and remission and depressive symptoms.  Would recommend continuing with trazodone for sleep, she has discontinued Lexapro few months back, and does not want to be on medication at this time.  She will follow-up in 3 months or earlier if needed.    Plan as mentioned below.    # Depression - recurrent and in remission -Self-discontinued Lexapro 10 mg daily.  # Generalized Anxiety disorder- Chronic and stable - As mentioned above for depression  # ADHD  - Improved  # Insomnia - Chronic and stable - Continue Trazodone 100mg  QHS  - Return to clinic in 12 weeks or early if needed.   MDM = 2 or more chronic conditions + med management  Darcel Smalling, MD 11/15/2023, 2:56 PM

## 2023-11-16 ENCOUNTER — Ambulatory Visit (INDEPENDENT_AMBULATORY_CARE_PROVIDER_SITE_OTHER): Payer: Medicare Other

## 2023-11-16 DIAGNOSIS — Z3042 Encounter for surveillance of injectable contraceptive: Secondary | ICD-10-CM | POA: Diagnosis not present

## 2023-11-16 NOTE — Progress Notes (Signed)
 Previous Depo 08-25-23. Per orders of Mordecai Maes, NP , injection of Depo-Provera  given by Eual Fines, cma in left deltoid.  Patient tolerated injection well. Patient will make next appointment 5-6 to 02-15-24.  Forwarding to Mayra Reel, NP in [facility] absence this afternoon.

## 2024-01-25 ENCOUNTER — Telehealth: Payer: Self-pay | Admitting: Nurse Practitioner

## 2024-01-25 NOTE — Telephone Encounter (Signed)
 Copied from CRM [id number]. Topic: Appointments - Scheduling Inquiry for Clinic >> Jan 25, 2024 12:26 PM Freya Jesus wrote: Reason for CRM: Patient calling to schedule Depo-Provera  .

## 2024-01-25 NOTE — Telephone Encounter (Signed)
 See encounter for 11/16/23,   Pt will make next appointment for  5-6 to 02-15-24.

## 2024-01-25 NOTE — Telephone Encounter (Signed)
 Please advise when we need to schedule this shot for the patient

## 2024-02-08 ENCOUNTER — Ambulatory Visit (INDEPENDENT_AMBULATORY_CARE_PROVIDER_SITE_OTHER)

## 2024-02-08 DIAGNOSIS — Z308 Encounter for other contraceptive management: Secondary | ICD-10-CM | POA: Diagnosis not present

## 2024-02-08 MED ORDER — MEDROXYPROGESTERONE ACETATE 150 MG/ML IM SUSP
150.0000 mg | Freq: Once | INTRAMUSCULAR | Status: AC
  Administered 2024-02-08: 150 mg via INTRAMUSCULAR

## 2024-02-08 NOTE — Progress Notes (Signed)
 Per orders of Aneta Bar, DPN AGNP-C, injection of Depo-Provera   given by Doretha Ganja in right deltoid. Patient tolerated injection well. Patient will make appointment for approx. 3 months.

## 2024-02-10 ENCOUNTER — Telehealth: Payer: BC Managed Care – PPO | Admitting: Child and Adolescent Psychiatry

## 2024-02-10 DIAGNOSIS — F909 Attention-deficit hyperactivity disorder, unspecified type: Secondary | ICD-10-CM

## 2024-02-10 DIAGNOSIS — F411 Generalized anxiety disorder: Secondary | ICD-10-CM | POA: Diagnosis not present

## 2024-02-10 DIAGNOSIS — F3341 Major depressive disorder, recurrent, in partial remission: Secondary | ICD-10-CM

## 2024-02-10 DIAGNOSIS — F5104 Psychophysiologic insomnia: Secondary | ICD-10-CM | POA: Diagnosis not present

## 2024-02-10 MED ORDER — TRAZODONE HCL 100 MG PO TABS: 100.0000 mg | ORAL_TABLET | Freq: Every day | ORAL | 0 refills | Status: DC

## 2024-02-10 NOTE — Progress Notes (Signed)
 Virtual Visit via Video Note  I connected with Christina Mccall on 02/10/24 at  1:30 PM EDT by a video enabled telemedicine application and verified that I am speaking with the correct person using two identifiers.  Location: Patient: home Provider: office   I discussed the limitations of evaluation and management by telemedicine and the availability of in person appointments. The patient expressed understanding and agreed to proceed.   I discussed the assessment and treatment plan with the patient. The patient was provided an opportunity to ask questions and all were answered. The patient agreed with the plan and demonstrated an understanding of the instructions.   The patient was advised to call back or seek an in-person evaluation if the symptoms worsen or if the condition fails to improve as anticipated.    [name], MD   [facility] MD/PA/NP OP Progress Note  02/10/2024 2:06 PM [name]  MRN:  [id number]  Chief Complaint:  Medication management follow-up for mood and anxiety.   Synopsis: This is a 24 year old Caucasian female with medical history significant of left hemiparesis since 2002, generalized epilepsy and psychiatric history significant of mood disorder, generalized anxiety disorder and ADHD has been under outpatient psychiatric care at [facility] since 2015, and med trials include Effexor  XR, Lamictal , Abilify , Topamax  in the past, currently on Zoloft  and Trazodone .   HPI:   Christina Mccall was seen and evaluated over telemedicine encounter for medication management follow-up.  She was presented for her appointment alone.  She reported that since last 2 months, she has been finding herself more anxious, more worried her mood is fluctuating more frequently.  She reported that she has not engaged with her fianc, she has worries that her fianc might be with her because he has not been engaging with her as much.  She reported that her fiance's friend passed away recently and therefore  he has been telling her that he needs some time.  She reported that she is not capable of helping him.  She also reported that because of her inability to drive, she stares at home and that also leads to over thinking, feelings of worthlessness and feelings that she is a burden on others.  She reported that she is oversleeping, eating well, had brief SI intermittently in the context of anxiety, feelings of worthlessness, reported that it lasted for about 2 minutes and she was able to distract herself by using her coping skills such as deep breathing.  She has not been taking Lexapro , had her discontinued prior to last appointment in February.  She does not want to restart taking medications, feels that when she starts going to work and school she will start feeling better.  We discussed pros and cons of medications, and that she had done better on Lexapro , she feels that on antidepressant she feels more down.  This does not seem to reflect on her previous reports during her phosphorous when she was taking Lexapro .  She does not agree with the recommendation of starting Lexapro  at this time or any other medications except trazodone .  She is willing to try therapy, and was given her side [email or web address] to search for therapist.  She will look into it.  We also discussed, that I will be transitioning out of the clinic, and will only have one day at the clinic, therefore it will not be possible for me to continue to see her.  Gave her another appointment in June and requested her to start searching for psychiatrist in  the community. She verbalized understanding.  Visit Diagnosis:    ICD-10-CM   1. Recurrent major depressive disorder, in partial remission (HCC)  F33.41 traZODone  (DESYREL ) 100 MG tablet         Past Psychiatric History: As mentioned in initial H&P, reviewed today, as following Patient has never been hospitalized psychiatrically, does not have any history of suicide, was previously seeing  Dr. Hubert Madden and then Dr. Elva Hamburger at the [facility] outpatient clinic before transitioning to Dr. Martha Slack and subsequently to this writer.  She is currently not in any psychotherapy.   Past Medical History:  Past Medical History:  Diagnosis Date   Anxiety    Cerebral infarction involving right cerebellar artery (HCC)    intrauterine   CP (cerebral palsy) (HCC)    Epilepsy (HCC)    Headache(784.0)    Left hemiparesis (HCC)    Seizures (HCC)     Past Surgical History:  Procedure Laterality Date   NO PAST SURGERIES      Family Psychiatric History: Reviewed today and as following Paternal Uncle - Depression; Mother - Anxiety, reviewed today and no change.  Family History:  Family History  Problem Relation Age of Onset   Migraines Maternal Grandfather    Alcohol abuse Mother    Anxiety disorder Father    Alcohol abuse Father     Social History:  Social History   Socioeconomic History   Marital status: Single    Spouse name: Not on file   Number of children: 0   Years of education: Not on file   Highest education level: Not on file  Occupational History   Not on file  Tobacco Use   Smoking status: Never    Passive exposure: Never   Smokeless tobacco: Never  Vaping Use   Vaping status: Never Used  Substance and Sexual Activity   Alcohol use: No   Drug use: No   Sexual activity: Never  Other Topics Concern   Not on file  Social History Narrative   Ahnyah is a 9th grade student at [employer]; she does well in school. She lives with her parents and siblings in separate households. She enjoys swimming, being with her family and eating ice cream.       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       Hobbies: hanging out with the family   Live with father and step mother and step brother (younger)   Social Drivers of Corporate investment banker Strain: Not on file  Food Insecurity: Not on file  Transportation Needs: Not on file  Physical  Activity: Not on file  Stress: Not on file  Social Connections: Not on file    Allergies:  Allergies  Allergen Reactions   Benadryl [Diphenhydramine] Other (See Comments)    seizures    Metabolic Disorder Labs: No results found for: "HGBA1C", "MPG" No results found for: "PROLACTIN" Lab Results  Component Value Date   CHOL 159 09/23/2022   TRIG 82.0 09/23/2022   HDL 45.90 09/23/2022   CHOLHDL 3 09/23/2022   VLDL 16.4 09/23/2022   LDLCALC 97 09/23/2022   Lab Results  Component Value Date   TSH 3.60 09/23/2022   TSH 1.970 03/11/2018    Therapeutic Level Labs: No results found for: "LITHIUM" No results found for: "VALPROATE" No results found for: "CBMZ"  Current Medications: Current Outpatient Medications  Medication Sig Dispense Refill   naproxen  (NAPROSYN ) 500 MG tablet Take  1 tablet (500 mg total) by mouth 2 (two) times daily with a meal. 20 tablet 2   traZODone  (DESYREL ) 100 MG tablet Take 1 tablet (100 mg total) by mouth at bedtime. 90 tablet 0   Current Facility-Administered Medications  Medication Dose Route Frequency Provider Last Rate Last Admin   medroxyPROGESTERone  (DEPO-PROVERA ) injection 150 mg  150 mg Intramuscular Q90 days    150 mg at 11/16/23 1501     Musculoskeletal:  Gait & Station:unable to assess since visit was over the telemedicine. Patient leans: unable to assess since visit was over the telemedicine.  Psychiatric Specialty Exam: ROSReview of 12 systems negative except as mentioned in HPI  There were no vitals taken for this visit.There is no height or weight on file to calculate BMI.  General Appearance: Casual  Eye Contact:  Fair  Speech:  Clear and Coherent and Normal Rate  Volume:  Normal  Mood:  "not good..."  Affect:  Appropriate, Congruent, and Restricted  Thought Process:  Goal Directed and Linear  Orientation:  Full (Time, Place, and Person)  Thought Content: Logical   Suicidal Thoughts:  No  Homicidal Thoughts:  No   Memory:  Immediate;   Fair Recent;   Fair Remote;   Fair  Judgement:  Fair  Insight:  Fair  Psychomotor Activity:  Normal  Concentration:  Concentration: Fair and Attention Span: Fair  Recall:  [employer] of Knowledge: Fair  Language: Fair  Akathisia:  NA  Handed:  Right  AIMS (if indicated): not done  Assets:  Desire for Improvement Financial Resources/Insurance Housing Leisure Time Social Support  ADL's:  Intact  Cognition: WNL  Sleep:   Fair   Screenings: GAD-7    Flowsheet Row Video Visit from 11/15/2023 in [facility] Office Visit from 08/19/2023 in [facility] [place] Office Visit from 04/15/2022 in [facility]  Total GAD-7 Score 6 11 7       PHQ2-9    Flowsheet Row Video Visit from 11/15/2023 in [facility] Office Visit from 08/19/2023 in [facility] [place] Office Visit from 09/07/2022 in [facility] [place] Office Visit from 09/03/2022 in [facility] [place]. [facility] Office Visit from 06/12/2022 in [facility] [place]. [facility]  PHQ-2 Total Score 1 2 3 2 1   PHQ-9 Total Score 9 11 8 9 5       Flowsheet Row ED from 09/06/2023 in [facility] [place] [facility] at [facility] Video Visit from 05/12/2022 in [facility] Office Visit from 04/15/2022 in [facility]  C-SSRS RISK CATEGORY No Risk Error: Q3, 4, or 5 should not be populated when Q2 is No High Risk        Assessment and Plan:   Plan reviewed on 02/10/24.  Reviewed response to her current medications and she appears to have worsening of anxiety and depressed mood in the context of current psychosocial stressors.  She is  recommended to restart Lexapro  however she does not want to at this time.  She was advised to start individual therapy and was given website [email or web address], and reach out to her insurance company to find a therapist that are covered and establish.    Plan  as mentioned below.    # Depression - recurrent and mild to moderate - Does not want to restart Lexapro  10 mg daily.  # Generalized Anxiety disorder- Chronic and stable - As mentioned above for depression  # ADHD  - Improved  # Insomnia - Chronic and stable - Continue Trazodone  100mg  QHS  - Return to clinic in 4-6 weeks or early if needed.    [name], MD 02/10/2024, 2:06 PM

## 2024-03-01 ENCOUNTER — Other Ambulatory Visit: Payer: Self-pay | Admitting: Child and Adolescent Psychiatry

## 2024-03-01 DIAGNOSIS — F3341 Major depressive disorder, recurrent, in partial remission: Secondary | ICD-10-CM

## 2024-03-22 ENCOUNTER — Telehealth: Admitting: Child and Adolescent Psychiatry

## 2024-03-22 DIAGNOSIS — F411 Generalized anxiety disorder: Secondary | ICD-10-CM

## 2024-03-22 DIAGNOSIS — F330 Major depressive disorder, recurrent, mild: Secondary | ICD-10-CM

## 2024-03-22 NOTE — Progress Notes (Signed)
 Virtual Visit via Video Note  I connected with Christina Mccall on 03/22/24 at 11:00 AM EDT by a video enabled telemedicine application and verified that I am speaking with the correct person using two identifiers.  Location: Patient: home Provider: office   I discussed the limitations of evaluation and management by telemedicine and the availability of in person appointments. The patient expressed understanding and agreed to proceed.   I discussed the assessment and treatment plan with the patient. The patient was provided an opportunity to ask questions and all were answered. The patient agreed with the plan and demonstrated an understanding of the instructions.   The patient was advised to call back or seek an in-person evaluation if the symptoms worsen or if the condition fails to improve as anticipated.    Christina CHRISTELLA Marek, MD   [facility] MD/PA/NP OP Progress Note  03/22/2024 11:27 AM Christina Mccall  MRN:  [id number]  Chief Complaint:  Management follow-up for mood and anxiety.   Synopsis: This is a 23 year old Caucasian female with medical history significant of left hemiparesis since 2002, generalized epilepsy and psychiatric history significant of mood disorder, generalized anxiety disorder and ADHD has been under outpatient psychiatric care at [facility] since 2015, and med trials include Effexor  XR, Lamictal , Abilify , Topamax  in the past, currently on Zoloft  and Trazodone .    HPI:   Christina Mccall was seen and evaluated over telemedicine encounter for medication management follow-up.  She was presented for her appointment alone.  She reported that over the last month, she has started attending [facility], she goes there from 9 AM to 1:30 PM, transportation is provided by the program.   She reported that she really likes her program, they do social activities, and they have social groups, and once a week they have group psychotherapy.  She reported that she can  continue with this program till she wants to Continue.  She reported that she continues to have intermittent worsening of mood in the context of overthinking about her relationship with her fianc, and other stressors.  She reported that overall her mood is manageable, and she has been doing okay with her anxiety.  She denied any SI or HI, sleeping well, is planning to discontinue trazodone , has not been taking Lexapro  and is not willing to restart however she saw a nurse practitioner at her current program and she recommended to start her on Lamictal , which she is planning to start.  She reported that her nurse practitioner can continue to manage her medications at the program and we also previously discussed that I will be transitioning out of the clinic with very limited availability and we planned at the last appointment for her transfer of psychiatric care.  She will call back over the next few months if she has any questions.   Visit Diagnosis:    ICD-10-CM   1. Generalized anxiety disorder  F41.1     2. Mild episode of recurrent major depressive disorder (HCC)  F33.0           Past Psychiatric History: As mentioned in initial H&P, reviewed today, as following Patient has never been hospitalized psychiatrically, does not have any history of suicide, was previously seeing Dr. Von and then Dr. ONEIDA at the [facility] outpatient clinic before transitioning to Dr. Fredia and subsequently to this writer.  She is currently not in any psychotherapy.   Past Medical History:  Past Medical History:  Diagnosis Date   Anxiety  Cerebral infarction involving right cerebellar artery (HCC)    intrauterine   CP (cerebral palsy) (HCC)    Epilepsy (HCC)    Headache(784.0)    Left hemiparesis (HCC)    Seizures (HCC)     Past Surgical History:  Procedure Laterality Date   NO PAST SURGERIES      Family Psychiatric History: Reviewed today and as following Paternal Uncle - Depression; Mother -  Anxiety, reviewed today and no change.  Family History:  Family History  Problem Relation Age of Onset   Migraines Maternal Grandfather    Alcohol abuse Mother    Anxiety disorder Father    Alcohol abuse Father     Social History:  Social History   Socioeconomic History   Marital status: Single    Spouse name: Not on file   Number of children: 0   Years of education: Not on file   Highest education level: Not on file  Occupational History   Not on file  Tobacco Use   Smoking status: Never    Passive exposure: Never   Smokeless tobacco: Never  Vaping Use   Vaping status: Never Used  Substance and Sexual Activity   Alcohol use: No   Drug use: No   Sexual activity: Never  Other Topics Concern   Not on file  Social History Narrative   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 She lives with her parents and siblings in separate households. She enjoys swimming, being with her family and eating ice cream.       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       Hobbies: hanging out with the family   Live with father and step mother and step brother (younger)   Social Drivers of Corporate investment banker Strain: Not on file  Food Insecurity: Not on file  Transportation Needs: Not on file  Physical Activity: Not on file  Stress: Not on file  Social Connections: Not on file    Allergies:  Allergies  Allergen Reactions   Benadryl [Diphenhydramine] Other (See Comments)    seizures    Metabolic Disorder Labs: No results found for: HGBA1C, MPG No results found for: PROLACTIN Lab Results  Component Value Date   CHOL 159 09/23/2022   TRIG 82.0 09/23/2022   HDL 45.90 09/23/2022   CHOLHDL 3 09/23/2022   VLDL 16.4 09/23/2022   LDLCALC 97 09/23/2022   Lab Results  Component Value Date   TSH 3.60 09/23/2022   TSH 1.970 03/11/2018    Therapeutic Level Labs: No results found for:  LITHIUM No results found for: VALPROATE No results found for: CBMZ  Current Medications: Current Outpatient Medications  Medication Sig Dispense Refill   naproxen  (NAPROSYN ) 500 MG tablet Take 1 tablet (500 mg total) by mouth 2 (two) times daily with a meal. 20 tablet 2   Current Facility-Administered Medications  Medication Dose Route Frequency Provider Last Rate Last Admin   medroxyPROGESTERone  (DEPO-PROVERA ) injection 150 mg  150 mg Intramuscular Q90 days    150 mg at 11/16/23 1501     Musculoskeletal:  Gait & Station:unable to assess since visit was over the telemedicine. Patient leans: unable to assess since visit was over the telemedicine.  Psychiatric Specialty Exam: ROSReview of 12 systems negative except as mentioned in HPI  There were no vitals taken for this visit.There is no height or weight on file to calculate  BMI.  General Appearance: Casual  Eye Contact:  Fair  Speech:  Clear and Coherent and Normal Rate  Volume:  Normal  Mood:  good...  Affect:  Appropriate, Congruent, and Restricted  Thought Process:  Goal Directed and Linear  Orientation:  Full (Time, Place, and Person)  Thought Content: Logical   Suicidal Thoughts:  No  Homicidal Thoughts:  No  Memory:  Immediate;   Fair Recent;   Fair Remote;   Fair  Judgement:  Fair  Insight:  Fair  Psychomotor Activity:  Normal  Concentration:  Concentration: Fair and Attention Span: Fair  Recall:  [employer] of Knowledge: Fair  Language: Fair  Akathisia:  NA  Handed:  Right  AIMS (if indicated): not done  Assets:  Desire for Improvement Financial Resources/Insurance Housing Leisure Time Social Support  ADL's:  Intact  Cognition: WNL  Sleep:   Fair   Screenings: GAD-7    Flowsheet Row Video Visit from 11/15/2023 in [facility] Office Visit from 08/19/2023 in [facility] [place] Office Visit from 04/15/2022 in [facility]  Total GAD-7 Score 6 11 7    PHQ2-9    Flowsheet Row Video Visit from 11/15/2023 in [facility] Office Visit from 08/19/2023 in [facility] [place] Office Visit from 09/07/2022 in [facility] [place]. [facility] Office Visit from 09/03/2022 in [facility] [place] Office Visit from 06/12/2022 in [facility] [place]. [facility]  PHQ-2 Total Score 1 2 3 2 1   PHQ-9 Total Score 9 11 8 9 5    Flowsheet Row ED from 09/06/2023 in [facility] at [facility] Video Visit from 05/12/2022 in [facility] Office Visit from 04/15/2022 in [facility]  C-SSRS RISK CATEGORY No Risk Error: Q3, 4, or 5 should not be populated when Q2 is No High Risk     Assessment and Plan:   Plan reviewed on 03/22/24.  Reviewed response to her current medications and she appears to have overall stability with her mood and anxiety except intermittent worsening in the context of psychosocial stressors.  She is now attending social groups, group therapy, and at behavioral health setting in [place] and this seems to provide her more things to do and has helped with her mood and anxiety.  She is also seeing a psychiatric nurse practitioner there for medication management and will continue to follow up with them.    Plan as mentioned below.    # Depression - recurrent and mild  - Does not want to restart Lexapro  10 mg daily. - Informs me that she is being started on Lamictal  25 mg daily by psychiatric nurse petitioner today.  # Generalized Anxiety disorder- Chronic and stable - As mentioned above for depression  # ADHD  - Improved  # Insomnia - Chronic and stable - Continue Trazodone  100mg  at  bedtime, however she is planning to discontinue it.    Christina CHRISTELLA Marek, MD 03/22/2024, 11:27 AM

## 2024-05-05 ENCOUNTER — Encounter: Payer: Self-pay | Admitting: Nurse Practitioner

## 2024-05-05 ENCOUNTER — Ambulatory Visit (INDEPENDENT_AMBULATORY_CARE_PROVIDER_SITE_OTHER): Admitting: Nurse Practitioner

## 2024-05-05 VITALS — BP 100/62 | HR 67 | Temp 98.0°F | Ht 64.5 in | Wt 160.0 lb

## 2024-05-05 DIAGNOSIS — G808 Other cerebral palsy: Secondary | ICD-10-CM

## 2024-05-05 DIAGNOSIS — Z131 Encounter for screening for diabetes mellitus: Secondary | ICD-10-CM

## 2024-05-05 DIAGNOSIS — G43009 Migraine without aura, not intractable, without status migrainosus: Secondary | ICD-10-CM | POA: Diagnosis not present

## 2024-05-05 DIAGNOSIS — F321 Major depressive disorder, single episode, moderate: Secondary | ICD-10-CM | POA: Insufficient documentation

## 2024-05-05 DIAGNOSIS — Z13220 Encounter for screening for lipoid disorders: Secondary | ICD-10-CM | POA: Diagnosis not present

## 2024-05-05 DIAGNOSIS — Z308 Encounter for other contraceptive management: Secondary | ICD-10-CM | POA: Diagnosis not present

## 2024-05-05 MED ORDER — TWIRLA 120-30 MCG/24HR TD PTWK: MEDICATED_PATCH | TRANSDERMAL | 3 refills | Status: DC

## 2024-05-05 NOTE — Patient Instructions (Signed)
 Nice to see you today I will be in touch with the labs once I have them Follow up with me in 1 year, sooner if you need me

## 2024-05-05 NOTE — Progress Notes (Signed)
 Established Patient Office Visit  Subjective   Patient ID: Christina Mccall, female    DOB: March 15, 2001  Age: 23 y.o. MRN: [id number]  Chief Complaint  Patient presents with   Annual Exam    Pt would like to discuss changing birth control methods to the patch. Pt does not want to do shots anymore.     HPI  Migraine: Patient was seen by colleague, Dr. Cleatus.  Patient was placed on muscle relaxer and referred to the headache clinic.  This was back in 2023. Statse that she is having them daily. States that she never followed up with the headache clinic.  Christina Mccall  Seizure: History of the same been approximately 5 years since last seizure.  Patient not currently followed by neurology or any antiepileptic medication  MDD: Patient currently followed by psychiatry managed on Lamictal  25 mg daily and trazodone  100 mg nightly as needed.  Patient and anticipate stopping trazodone  at last office visit in June 2025. States that hse was going to [place] and was seeing a NP and stopped the telel medicine. She has appointmetn this coming Wednesday.   OCP: patient was on depomedrol. Last injectoin was 02/08/2024. Patient would like to try the patch as a form of birth contril. States that she does not have periods on the depo.   for complete physical and follow up of chronic conditions.  Immunizations: -Tetanus: Completed in 2023 -Influenza: Adult female -Shingles: 2 young -Pneumonia: Too young -HPV: Up-to-date -COVID: refused   Diet: Fair diet. She is eaint 3 meals a day and some snacks. She is drinking water and soda  Exercise: No regular exercise. She going on walks 3 times a week for 20 mins  Eye exam: Completes annually. Glase[place]. Have appt coming  Dental exam: Needs updating    Colonoscopy: Too young, currently average risk Lung Cancer Screening: N/A I  Pap smear: 06/12/2022 by Dr. Delores negative STI negative cells.  Mammogram: Too young  DEXA: Too young  Sleep: going to bed around  at 10 and get up aroun 7-9. Feels rested most times. Does snore intermttent    Review of Systems  Constitutional:  Negative for chills and fever.  Respiratory:  Negative for shortness of breath.   Cardiovascular:  Negative for chest pain and leg swelling.  Gastrointestinal:  Negative for abdominal pain, blood in stool, constipation, diarrhea, nausea and vomiting.       BM daily   Genitourinary:  Negative for dysuria and hematuria.  Neurological:  Positive for headaches. Negative for tingling.  Psychiatric/Behavioral:  Negative for hallucinations and suicidal ideas.       Objective:     BP 100/62   Pulse 67   Temp 98 F (36.7 C) (Oral)   Ht 5' 4.5 (1.638 m)   Wt 160 lb (72.6 kg)   SpO2 98%   BMI 27.04 kg/m  BP Readings from Last 3 Encounters:  05/05/24 100/62  09/06/23 114/75  08/19/23 116/66   Wt Readings from Last 3 Encounters:  05/05/24 160 lb (72.6 kg)  09/06/23 159 lb 9.6 oz (72.4 kg)  08/19/23 159 lb 9.6 oz (72.4 kg)   SpO2 Readings from Last 3 Encounters:  05/05/24 98%  09/06/23 95%  08/19/23 95%      Physical Exam Vitals and nursing note reviewed.  Constitutional:      Appearance: Normal appearance.  HENT:     Right Ear: Tympanic membrane, ear canal and external ear normal.     Left Ear: Tympanic membrane,  ear canal and external ear normal.     Mouth/Throat:     Mouth: Mucous membranes are moist.     Pharynx: Oropharynx is clear.  Eyes:     Extraocular Movements: Extraocular movements intact.     Pupils: Pupils are equal, round, and reactive to light.  Cardiovascular:     Rate and Rhythm: Normal rate and regular rhythm.     Pulses: Normal pulses.     Heart sounds: Normal heart sounds.  Pulmonary:     Effort: Pulmonary effort is normal.     Breath sounds: Normal breath sounds.  Abdominal:     General: Bowel sounds are normal. There is no distension.     Palpations: There is no mass.     Tenderness: There is no abdominal tenderness.      Hernia: No hernia is present.  Musculoskeletal:     Right lower leg: No edema.     Left lower leg: No edema.  Lymphadenopathy:     Cervical: No cervical adenopathy.  Skin:    General: Skin is warm.  Neurological:     General: No focal deficit present.     Mental Status: She is alert. Mental status is at baseline.     Deep Tendon Reflexes:     Reflex Scores:      Bicep reflexes are 2+ on the right side and 2+ on the left side.      Patellar reflexes are 2+ on the right side and 2+ on the left side.    Comments: Bilateral upper and lower extremity strength 5/5  LUE contracture   Psychiatric:        Mood and Affect: Mood normal.        Behavior: Behavior normal.        Thought Content: Thought content normal.        Judgment: Judgment normal.      No results found for any visits on 05/05/24.    The ASCVD Risk score (Arnett DK, et al., 2019) failed to calculate for the following reasons:   The 2019 ASCVD risk score is only valid for ages 3 to 43   Risk score cannot be calculated because patient has a medical history suggesting prior/existing ASCVD    Assessment & Plan:   Problem List Items Addressed This Visit       Cardiovascular and Mediastinum   Migraine without aura (Chronic)   History of the same was referred to headache clinic never followed through.  Having daily headaches no aura ambulatory furl to neurology      Relevant Orders   Ambulatory referral to Neurology   CBC with Differential/Platelet   Comprehensive metabolic panel with GFR   TSH     Nervous and Auditory   Congenital hemiplegia (HCC) (Chronic)   Congenital hemiplegia.  Patient has no clotting abnormalities left upper extremity contracture with limited range of motion        Other   Encounter for other contraceptive management - Primary   Patient has had Nexplanon  with breakthrough bleeding Depo-Medrol but lives an hour away from clinic now she would like to try the patch.  Do not see any  clear contraindication for estrogen therapy.  She has been on estrogen containing OCP in the past.  Patient's condition was congenital.  No abnormal clotting disorders personally or in the family that she is aware of.  We will try Twirla  1 patch weekly for 3 weeks take 1 week off.  Did discuss  needing to use backup contraception for at least 2 weeks while switching birth controls and the possibility of period irregularity      Relevant Medications   Levonorgestrel-Eth Estradiol  (TWIRLA ) 120-30 MCG/24HR PTWK   Current moderate episode of major depressive disorder (HCC)   Was been seen by telepsych Kia tree but no longer.  Patient was on trazodone  and SSRI but no longer.  Patient was doing group with psychiatric nurse practitioner no longer.  Patient has a new appointment with psychiatrist where she lives next week.  Denies HI/SI/AVH.      Relevant Orders   TSH   Other Visit Diagnoses       Screening for lipid disorders       Relevant Orders   Lipid panel     Screening for diabetes mellitus       Relevant Orders   Hemoglobin A1c       Return in about 1 year (around 05/05/2025) for CPE and Labs.    Adina Crandall, NP

## 2024-05-05 NOTE — Assessment & Plan Note (Signed)
 Patient has had Nexplanon  with breakthrough bleeding Depo-Medrol but lives an hour away from clinic now she would like to try the patch.  Do not see any clear contraindication for estrogen therapy.  She has been on estrogen containing OCP in the past.  Patient's condition was congenital.  No abnormal clotting disorders personally or in the family that she is aware of.  We will try Twirla  1 patch weekly for 3 weeks take 1 week off.  Did discuss needing to use backup contraception for at least 2 weeks while switching birth controls and the possibility of period irregularity

## 2024-05-05 NOTE — Assessment & Plan Note (Signed)
 History of the same was referred to headache clinic never followed through.  Having daily headaches no aura ambulatory furl to neurology

## 2024-05-05 NOTE — Assessment & Plan Note (Signed)
 Was been seen by telepsych Kia tree but no longer.  Patient was on trazodone  and SSRI but no longer.  Patient was doing group with psychiatric nurse practitioner no longer.  Patient has a new appointment with psychiatrist where she lives next week.  Denies HI/SI/AVH.

## 2024-05-05 NOTE — Assessment & Plan Note (Signed)
 Congenital hemiplegia.  Patient has no clotting abnormalities left upper extremity contracture with limited range of motion

## 2024-05-06 LAB — CBC WITH DIFFERENTIAL/PLATELET
Absolute Lymphocytes: 2339 {cells}/uL (ref 850–3900)
Absolute Monocytes: 483 {cells}/uL (ref 200–950)
Basophils Absolute: 48 {cells}/uL (ref 0–200)
Basophils Relative: 0.7 %
Eosinophils Absolute: 69 {cells}/uL (ref 15–500)
Eosinophils Relative: 1 %
HCT: 42 % (ref 35.0–45.0)
Hemoglobin: 13.6 g/dL (ref 11.7–15.5)
MCH: 29.1 pg (ref 27.0–33.0)
MCHC: 32.4 g/dL (ref 32.0–36.0)
MCV: 89.9 fL (ref 80.0–100.0)
MPV: 11.7 fL (ref 7.5–12.5)
Monocytes Relative: 7 %
Neutro Abs: 3961 {cells}/uL (ref 1500–7800)
Neutrophils Relative %: 57.4 %
Platelets: 236 Thousand/uL (ref 140–400)
RBC: 4.67 Million/uL (ref 3.80–5.10)
RDW: 11.8 % (ref 11.0–15.0)
Total Lymphocyte: 33.9 %
WBC: 6.9 Thousand/uL (ref 3.8–10.8)

## 2024-05-06 LAB — LIPID PANEL
Cholesterol: 153 mg/dL (ref <200)
HDL: 46 mg/dL — ABNORMAL LOW (ref >=50)
LDL Cholesterol (Calc): 91 mg/dL
Non-HDL Cholesterol (Calc): 107 mg/dL (ref <130)
Total CHOL/HDL Ratio: 3.3 (calc) (ref <5.0)
Triglycerides: 75 mg/dL (ref <150)

## 2024-05-06 LAB — COMPREHENSIVE METABOLIC PANEL WITH GFR
AG Ratio: 2 (calc) (ref 1.0–2.5)
ALT: 12 U/L (ref 6–29)
AST: 11 U/L (ref 10–30)
Albumin: 4.5 g/dL (ref 3.6–5.1)
Alkaline phosphatase (APISO): 73 U/L (ref 31–125)
BUN: 10 mg/dL (ref 7–25)
CO2: 24 mmol/L (ref 20–32)
Calcium: 9.4 mg/dL (ref 8.6–10.2)
Chloride: 108 mmol/L (ref 98–110)
Creat: 0.82 mg/dL (ref 0.50–0.96)
Globulin: 2.3 g/dL (ref 1.9–3.7)
Glucose, Bld: 86 mg/dL (ref 65–99)
Potassium: 4.7 mmol/L (ref 3.5–5.3)
Sodium: 142 mmol/L (ref 135–146)
Total Bilirubin: 0.4 mg/dL (ref 0.2–1.2)
Total Protein: 6.8 g/dL (ref 6.1–8.1)
eGFR: 102 mL/min/1.73m2 (ref >=60)

## 2024-05-06 LAB — HEMOGLOBIN A1C
Hgb A1c MFr Bld: 5.1 % (ref <5.7)
Mean Plasma Glucose: 100 mg/dL
eAG (mmol/L): 5.5 mmol/L

## 2024-05-06 LAB — TSH: TSH: 1.71 m[IU]/L

## 2024-05-09 ENCOUNTER — Encounter: Payer: Self-pay | Admitting: Neurology

## 2024-05-09 ENCOUNTER — Ambulatory Visit: Payer: Self-pay | Admitting: Nurse Practitioner

## 2024-06-20 ENCOUNTER — Encounter: Payer: Self-pay | Admitting: Adult Health

## 2024-06-20 ENCOUNTER — Ambulatory Visit: Admitting: Adult Health

## 2024-06-20 VITALS — BP 97/72 | HR 57 | Ht 63.0 in | Wt 160.5 lb

## 2024-06-20 DIAGNOSIS — Z01419 Encounter for gynecological examination (general) (routine) without abnormal findings: Secondary | ICD-10-CM

## 2024-06-20 DIAGNOSIS — N920 Excessive and frequent menstruation with regular cycle: Secondary | ICD-10-CM | POA: Diagnosis not present

## 2024-06-20 DIAGNOSIS — F32A Depression, unspecified: Secondary | ICD-10-CM | POA: Diagnosis not present

## 2024-06-20 DIAGNOSIS — Z3202 Encounter for pregnancy test, result negative: Secondary | ICD-10-CM

## 2024-06-20 DIAGNOSIS — N946 Dysmenorrhea, unspecified: Secondary | ICD-10-CM | POA: Diagnosis not present

## 2024-06-20 DIAGNOSIS — Z3009 Encounter for other general counseling and advice on contraception: Secondary | ICD-10-CM | POA: Insufficient documentation

## 2024-06-20 DIAGNOSIS — Z113 Encounter for screening for infections with a predominantly sexual mode of transmission: Secondary | ICD-10-CM | POA: Diagnosis not present

## 2024-06-20 DIAGNOSIS — F419 Anxiety disorder, unspecified: Secondary | ICD-10-CM | POA: Diagnosis not present

## 2024-06-20 DIAGNOSIS — G808 Other cerebral palsy: Secondary | ICD-10-CM

## 2024-06-20 DIAGNOSIS — G809 Cerebral palsy, unspecified: Secondary | ICD-10-CM

## 2024-06-20 LAB — POCT URINE PREGNANCY: Preg Test, Ur: NEGATIVE

## 2024-06-20 MED ORDER — MISOPROSTOL 200 MCG PO TABS: ORAL_TABLET | ORAL | 0 refills | Status: DC

## 2024-06-20 NOTE — Progress Notes (Signed)
 Patient ID: Christina Mccall, female   DOB: 03/19/2001, 23 y.o.   MRN: [id number] History of Present Illness: Christina Mccall is a 23 year old white female, single, G0P0, in for a well woman gyn exam. She has CP, and wants to discuss birth control she is thinking IUD. Her periods last about 7 days and may be heavy 1-2 days and changes pads every 4 hours, but she bleeds, stops and bleeds again and has bad cramps.     Component Value Date/Time   DIAGPAP  06/12/2022 1532    - Negative for intraepithelial lesion or malignancy (NILM)   ADEQPAP  06/12/2022 1532    Satisfactory for evaluation; transformation zone component PRESENT.   PCP is Dr Wendee. Current Medications, Allergies, Past Medical History, Past Surgical History, Family History and Social History were reviewed in [employer] record.     Review of Systems: Patient denies any hearing loss, fatigue, blurred vision, shortness of breath, chest pain, abdominal pain, problems with  intercourse. No joint pain or mood swings.  She has daily headaches and had migraines without aura.  She urinary frequency at times   +constipation, has tried miralax, no help, try increasing water, grapes apples with peeling and apple, white grape juice  Last sex about 1.5 weeks ago, none since period started   Physical Exam:BP 97/72 (BP Location: Left Arm, Patient Position: Sitting, Cuff Size: Normal)   Pulse (!) 57   Ht 5' 3 (1.6 m)   Wt 160 lb 8 oz (72.8 kg)   LMP 06/16/2024 (Exact Date)   BMI 28.43 kg/m  UPT is negative General:  Well developed, well nourished, no acute distress Skin:  Warm and dry Neck:  Midline trachea, normal thyroid , good ROM, no lymphadenopathy Lungs; Clear to auscultation bilaterally Breast:  No dominant palpable mass, retraction, or nipple discharge Cardiovascular: Regular rate and rhythm Abdomen:  Soft, non tender, no hepatosplenomegaly Pelvic:  External genitalia is normal in appearance, no lesions.  The vagina  is normal in appearance, +blood. Urethra has no lesions or masses. The cervix is nulliparous.  Uterus is felt to be normal size, shape, and contour.  No adnexal masses or tenderness noted.Bladder is non tender, no masses felt. Extremities/musculoskeletal:  No swelling or varicosities noted, no clubbing or cyanosis, she has left hemiparesis Psych:  No mood changes, alert and cooperative,seems happy AA is 4 Fall risk is moderate    06/20/2024    2:11 PM 05/05/2024    2:47 PM 11/15/2023    1:46 PM  Depression screen PHQ 2/9  Decreased Interest 2 2   Down, Depressed, Hopeless 1 2   PHQ - 2 Score 3 4   Altered sleeping 3 3   Tired, decreased energy 3 3   Change in appetite 3 3   Feeling bad or failure about yourself  1 3   Trouble concentrating 2 2   Moving slowly or fidgety/restless 1 0   Suicidal thoughts 0 0   PHQ-9 Score 16 18   Difficult doing work/chores  Somewhat difficult      Information is confidential and restricted. Go to Review Flowsheets to unlock data.       06/20/2024    2:12 PM 05/05/2024    2:47 PM 11/15/2023    1:44 PM 08/19/2023    2:48 PM  GAD 7 : Generalized Anxiety Score  Nervous, Anxious, on Edge 1 1  1   Control/stop worrying 3 1  3   Worry too much - different things 3  1  2  Trouble relaxing 1 0  1  Restless 2 0  1  Easily annoyed or irritable 1 1  1   Afraid - awful might happen 0 1  2  Total GAD 7 Score 11 5  11   Anxiety Difficulty  Somewhat difficult       Information is confidential and restricted. Go to Review Flowsheets to unlock data.      Upstream - 06/20/24 1423       Pregnancy Intention Screening   Does the patient want to become pregnant in the next year? No    Does the patient's partner want to become pregnant in the next year? No    Would the patient like to discuss contraceptive options today? Yes      Contraception Wrap Up   Current Method Withdrawal or Other Method    End Method Abstinence   to get IUD in 3 days   Contraception  Counseling Provided Yes          Examination chaperoned by Clarita Salt LPN  Impression and plan: 1. Negative pregnancy test - POCT urine pregnancy  2. Encounter for well woman exam with routine gynecological exam (Primary) Pap and physical in 1 year Labs with PCP  3. General counseling and advice on contraceptive management She wants IUD discussed options, will go with mirena  to help with periods  No sex Will rx Cytotec  to take 4 hours prior to IUD insertion with Dr Ozan 06/23/24 Gave handout on mirena    Meds ordered this encounter  Medications   misoprostol  (CYTOTEC ) 200 MCG tablet    Sig: Take 2 tablets by mouth 4 hours before appointment 06/23/24    Dispense:  2 tablet    Refill:  0    Supervising Provider:   JAYNE MINDER H [[id number]]    4. Menorrhagia with regular cycle Will get mirena  06/23/24  5. Dysmenorrhea  6. Anxiety and depression She is on meds and seeing therapist   7. Cerebral palsy, unspecified type (HCC)  8. Congenital hemiplegia (HCC)  9. Screening examination for STD (sexually transmitted disease) GC/CHL sent on urine  - GC/Chlamydia Probe Amp

## 2024-06-23 ENCOUNTER — Other Ambulatory Visit: Payer: Self-pay | Admitting: Adult Health

## 2024-06-23 ENCOUNTER — Ambulatory Visit (INDEPENDENT_AMBULATORY_CARE_PROVIDER_SITE_OTHER): Admitting: Obstetrics & Gynecology

## 2024-06-23 ENCOUNTER — Encounter: Payer: Self-pay | Admitting: Obstetrics & Gynecology

## 2024-06-23 VITALS — BP 97/62 | HR 73 | Ht 63.0 in | Wt 160.2 lb

## 2024-06-23 DIAGNOSIS — Z30430 Encounter for insertion of intrauterine contraceptive device: Secondary | ICD-10-CM | POA: Diagnosis not present

## 2024-06-23 DIAGNOSIS — Z308 Encounter for other contraceptive management: Secondary | ICD-10-CM

## 2024-06-23 MED ORDER — LEVONORGESTREL 19.5 MG IU IUD: INTRAUTERINE_SYSTEM | Freq: Once | INTRAUTERINE | Status: AC

## 2024-06-23 NOTE — Progress Notes (Signed)
 GYN VISIT Patient name: Christina Mccall MRN [id number]  Date of birth: 07/16/2001 Chief Complaint:   No chief complaint on file.  History of Present Illness:   Christina Mccall is a 23 y.o. G0P0000 female being seen today for the following concerns:  Contraceptive management: She has tried many options including pills, Nexplanon  and of recently Depot.  With the Depot she did not have a period and would prefer to have a menses.    In the past she has also tried Nexplanon , pills and states that the patch was not covered by her insurance  Since being off Depo she has had 1 period that lasted for 7 days with a few heavy days changing a pad every 4 hours.  She has done some reading on her own and is most interested in IUD   Patient's last menstrual period was 06/16/2024 (exact date).    Review of Systems:   Pertinent items are noted in HPI Denies fever/chills, dizziness, headaches, visual disturbances, fatigue, shortness of breath, chest pain, abdominal pain, vomiting.  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 Physical Assessment:   Vitals:   06/23/24 0851  BP: 97/62  Pulse: 73  Weight: 160 lb 3.2 oz (72.7 kg)  Height: 5' 3 (1.6 m)  Body mass index is 28.38 kg/m.       Physical Examination:   General appearance: alert, well appearing, and in no distress  Psych: mood appropriate, normal affect  Skin: warm & dry   Cardiovascular: normal heart rate noted  Respiratory: normal respiratory effort, no distress  Abdomen: soft, non-tender   Pelvic: VULVA: normal appearing vulva with no masses, tenderness or lesions, VAGINA:  normal appearing vagina with normal color and discharge, no lesions, CERVIX: normal appearing cervix without discharge or lesions  Extremities: no edema   Chaperone: Latisha Cresenzo    IUD INSERTION   The risks and benefits of the method and placement have been thouroughly reviewed with the patient and all questions were answered.  Specifically the patient is aware of failure rate of 09/998, expulsion of the IUD and of possible perforation.  The patient is aware of irregular bleeding due to the method and understands the incidence of irregular bleeding diminishes with time.  Signed copy of informed consent in chart.    A sterile speculum was placed in the vagina.  The cervix was visualized, prepped using Betadine, and grasped with a single tooth tenaculum. The uterus was sounded to 8 cm.  Kyleena   IUD placed per manufacturer's recommendations. The strings were trimmed to approximately 3 cm. The patient tolerated the procedure well.    Chaperone: Rutherford Rover    Assessment & Plan:   1)Contraceptive management -Reviewed all contraceptive options including different pill, vaginal ring or other LARCs -questions/concerns were addressed and desires to proceed with Kyleena  IUD -device placed as above without complications  Kyleena  IUD insertion The patient was given post procedure instructions, including signs and symptoms of infection and to check for the strings after each menses or each month, and refraining from intercourse or anything in the  vagina for 3 days. She was given a care card with date IUD placed, and date IUD to be removed. She is scheduled for a f/u appointment in  6- 8 weeks.   No orders of the defined types were placed in this encounter.   Return for 6-8wk IUD check up.   Charlea Nardo, DO Attending Obstetrician & Gynecologist, [facility] for [employer], [facility]

## 2024-06-24 LAB — GC/CHLAMYDIA PROBE AMP
Chlamydia trachomatis, NAA: NEGATIVE
Neisseria Gonorrhoeae by PCR: NEGATIVE

## 2024-06-26 ENCOUNTER — Ambulatory Visit: Payer: Self-pay | Admitting: Adult Health

## 2024-06-26 LAB — GC/CHLAMYDIA PROBE AMP
Chlamydia trachomatis, NAA: NEGATIVE
Neisseria Gonorrhoeae by PCR: NEGATIVE

## 2024-08-10 ENCOUNTER — Ambulatory Visit: Admitting: Obstetrics & Gynecology

## 2024-08-10 ENCOUNTER — Encounter: Payer: Self-pay | Admitting: Obstetrics & Gynecology

## 2024-08-10 VITALS — BP 101/67 | HR 76 | Ht 63.0 in | Wt 158.8 lb

## 2024-08-10 DIAGNOSIS — Z30431 Encounter for routine checking of intrauterine contraceptive device: Secondary | ICD-10-CM

## 2024-08-10 DIAGNOSIS — R102 Pelvic and perineal pain unspecified side: Secondary | ICD-10-CM

## 2024-08-10 NOTE — Progress Notes (Signed)
   GYN VISIT Patient name: Christina Mccall MRN [id number]  Date of birth: 04/12/2001 Chief Complaint:   Follow-up  History of Present Illness:   Christina Mccall is a 23 y.o. G0P0000 female being seen today for IUD check up.     Notes daily to every other day pelvic pain.  Will take ibuprofen/tylenol and will help sometimes.  Pain will last for a few hours then resolve.  Menses started moderate to heavy, but then turned light.  Typically the bleeding will be a week then continued spotting for a few weeks.  Denies irregular discharge, itching or irritation.  Previously on Depot and had tried COCs, but noted irregular bleeding.   No LMP recorded. (Menstrual status: IUD).    Review of Systems:   Pertinent items are noted in HPI Denies fever/chills, dizziness, headaches, visual disturbances, fatigue, shortness of breath, chest pain. 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 Physical Assessment:   Vitals:   08/10/24 1541  BP: 101/67  Pulse: 76  Weight: 158 lb 12.8 oz (72 kg)  Height: 5' 3 (1.6 m)  Body mass index is 28.13 kg/m.       Physical Examination:   General appearance: alert, well appearing, and in no distress  Psych: mood appropriate, normal affect  Skin: warm & dry   Cardiovascular: normal heart rate noted  Respiratory: normal respiratory effort, no distress  Abdomen: soft, non-tender, no rebound, no guardin g  Pelvic: VULVA: normal appearing vulva with no masses, tenderness or lesions, VAGINA: normal appearing vagina with normal color and discharge, no lesions, CERVIX: normal appearing cervix without  discharge or lesions, strings visualized at cervical os  Extremities: no edema   Chaperone: Alan Fischer    Assessment & Plan:  1) IUD check up -device appears in proper location both by ultrasound and on pelvic exam - Discussed options including removal today and transition to different form of contraception versus giving the device a little bit more time to see if she notes improvement of her symptoms -after much discussion and some time to think about it, she plans to give it a little more time -f/u prn or 24yr for annual   No orders of the defined types were placed in this encounter.   Return for Sept 2026 annual.   Kenna Seward, DO Attending Obstetrician & Gynecologist, [facility] for [facility], [facility]

## 2024-09-07 ENCOUNTER — Ambulatory Visit: Admitting: Neurology

## 2024-10-17 ENCOUNTER — Ambulatory Visit: Admitting: Adult Health

## 2024-10-17 ENCOUNTER — Encounter: Payer: Self-pay | Admitting: Adult Health

## 2024-10-17 VITALS — BP 115/77 | HR 70 | Ht 63.0 in | Wt 164.0 lb

## 2024-10-17 DIAGNOSIS — Z3202 Encounter for pregnancy test, result negative: Secondary | ICD-10-CM

## 2024-10-17 DIAGNOSIS — R102 Pelvic and perineal pain unspecified side: Secondary | ICD-10-CM | POA: Diagnosis not present

## 2024-10-17 DIAGNOSIS — Z30013 Encounter for initial prescription of injectable contraceptive: Secondary | ICD-10-CM | POA: Diagnosis not present

## 2024-10-17 DIAGNOSIS — Z30432 Encounter for removal of intrauterine contraceptive device: Secondary | ICD-10-CM | POA: Diagnosis not present

## 2024-10-17 LAB — POCT URINE PREGNANCY: Preg Test, Ur: NEGATIVE

## 2024-10-17 MED ORDER — MEDROXYPROGESTERONE ACETATE 150 MG/ML IM SUSP: 150.0000 mg | INTRAMUSCULAR | 4 refills | Status: AC

## 2024-10-17 MED ORDER — MEDROXYPROGESTERONE ACETATE 150 MG/ML IM SUSY
150.0000 mg | PREFILLED_SYRINGE | Freq: Once | INTRAMUSCULAR | Status: AC
  Administered 2024-10-17: 150 mg via INTRAMUSCULAR

## 2024-10-17 NOTE — Patient Instructions (Signed)
 Use condoms for 2 weeks Next depo in 12 weeks

## 2024-10-17 NOTE — Addendum Note (Signed)
 Addended by: NEYSA CLARITA RAMAN on: 10/17/2024 02:43 PM   Modules accepted: Orders

## 2024-10-17 NOTE — Progress Notes (Signed)
" °  Subjective:     Patient ID: Christina Mccall, female   DOB: Jan 14, 2001, 24 y.o.   MRN: [id number]  HPI Deeksha is a 24 year old white female, single, G0P0, in requesting IUD removal has had cramping and wants to get back on depo.     Component Value Date/Time   DIAGPAP  06/12/2022 1532    - Negative for intraepithelial lesion or malignancy (NILM)   ADEQPAP  06/12/2022 1532    Satisfactory for evaluation; transformation zone component PRESENT.     Review of Systems For IUD removal +cramping Reviewed past medical,surgical, social and family history. Reviewed medications and allergies.     Objective:   Physical Exam BP 115/77 (BP Location: Right Arm, Patient Position: Sitting, Cuff Size: Normal)   Pulse 70   Ht 5' 3 (1.6 m)   Wt 164 lb (74.4 kg)   LMP 09/16/2024 (Approximate)   BMI 29.05 kg/m  UPT is negative. Consent signed and time out called. IUD REMOVAL Skin warm and dry.Pelvic: external genitalia is normal in appearance no lesions, vagina:pink,urethra has no lesions or masses noted, cervix:smooth, IUD strings at os, grasped strings with forceps and pt asked to cough and IUD easily removed, uterus: normal size, shape and contour, non tender, no masses felt, adnexa: no masses or tenderness noted. Bladder is non tender and no masses felt.  Fall risk is low  Upstream - 10/17/24 1400       Pregnancy Intention Screening   Does the patient want to become pregnant in the next year? No    Does the patient's partner want to become pregnant in the next year? No    Would the patient like to discuss contraceptive options today? Yes      Contraception Wrap Up   Current Method IUD or IUS    End Method Hormonal Injection    Contraception Counseling Provided Yes    How was the end contraceptive method provided? Prescription            Assessment:     1. Negative pregnancy test  - POCT urine pregnancy  2. Encounter for IUD removal(PRIMARY) IUD removed   3. Pelvic  cramping +cramping with IUD, wants removed   4. Encounter for initial prescription of injectable contraceptive Firs depo given in office IM by Clarita Salt LPN     Plan:     Return in 12 weeks for depo    "

## 2025-01-09 ENCOUNTER — Ambulatory Visit
